# Patient Record
Sex: Male | Born: 1950 | Race: White | Hispanic: No | Marital: Married | Smoking: Former smoker
Health system: Southern US, Community
[De-identification: ages and names within clinical notes are randomized; demographics above are authoritative.]

## PROBLEM LIST (undated history)

## (undated) DIAGNOSIS — I739 Peripheral vascular disease, unspecified: Secondary | ICD-10-CM

## (undated) DIAGNOSIS — I639 Cerebral infarction, unspecified: Secondary | ICD-10-CM

## (undated) DIAGNOSIS — N209 Urinary calculus, unspecified: Secondary | ICD-10-CM

## (undated) DIAGNOSIS — N400 Enlarged prostate without lower urinary tract symptoms: Secondary | ICD-10-CM

## (undated) DIAGNOSIS — K219 Gastro-esophageal reflux disease without esophagitis: Secondary | ICD-10-CM

## (undated) DIAGNOSIS — N529 Male erectile dysfunction, unspecified: Secondary | ICD-10-CM

## (undated) DIAGNOSIS — N200 Calculus of kidney: Secondary | ICD-10-CM

## (undated) DIAGNOSIS — E271 Primary adrenocortical insufficiency: Secondary | ICD-10-CM

## (undated) DIAGNOSIS — I10 Essential (primary) hypertension: Secondary | ICD-10-CM

## (undated) DIAGNOSIS — E119 Type 2 diabetes mellitus without complications: Secondary | ICD-10-CM

## (undated) DIAGNOSIS — E7800 Pure hypercholesterolemia, unspecified: Secondary | ICD-10-CM

## (undated) DIAGNOSIS — R972 Elevated prostate specific antigen [PSA]: Secondary | ICD-10-CM

## (undated) DIAGNOSIS — I4891 Unspecified atrial fibrillation: Secondary | ICD-10-CM

## (undated) DIAGNOSIS — C4490 Unspecified malignant neoplasm of skin, unspecified: Secondary | ICD-10-CM

## (undated) DIAGNOSIS — Z87442 Personal history of urinary calculi: Secondary | ICD-10-CM

## (undated) HISTORY — DX: Unspecified malignant neoplasm of skin, unspecified: C44.90

## (undated) HISTORY — PX: PERCUTANEOUS STENT INTERVENTION: SHX6019

## (undated) HISTORY — DX: Elevated prostate specific antigen (PSA): R97.20

## (undated) HISTORY — DX: Urinary calculus, unspecified: N20.9

## (undated) HISTORY — DX: Cerebral infarction, unspecified: I63.9

## (undated) HISTORY — DX: Peripheral vascular disease, unspecified: I73.9

## (undated) HISTORY — DX: Type 2 diabetes mellitus without complications: E11.9

## (undated) HISTORY — DX: Benign prostatic hyperplasia without lower urinary tract symptoms: N40.0

## (undated) HISTORY — PX: SHOULDER SURGERY: SHX246

## (undated) HISTORY — DX: Pure hypercholesterolemia, unspecified: E78.00

## (undated) HISTORY — DX: Male erectile dysfunction, unspecified: N52.9

## (undated) HISTORY — PX: HERNIA REPAIR: SHX51

## (undated) HISTORY — DX: Gastro-esophageal reflux disease without esophagitis: K21.9

## (undated) HISTORY — DX: Calculus of kidney: N20.0

---

## 2009-02-14 ENCOUNTER — Emergency Department (HOSPITAL_COMMUNITY): Admission: EM | Admit: 2009-02-14 | Discharge: 2009-02-14 | Payer: Self-pay | Admitting: Emergency Medicine

## 2010-06-08 LAB — CBC
HCT: 45.7 % (ref 39.0–52.0)
MCHC: 33.4 g/dL (ref 30.0–36.0)
MCV: 92.4 fL (ref 78.0–100.0)
RBC: 4.95 MIL/uL (ref 4.22–5.81)
WBC: 9.6 10*3/uL (ref 4.0–10.5)

## 2010-06-08 LAB — URINALYSIS, ROUTINE W REFLEX MICROSCOPIC
Ketones, ur: 15 mg/dL — AB
Leukocytes, UA: NEGATIVE
Nitrite: POSITIVE — AB
Specific Gravity, Urine: >1.03 — ABNORMAL HIGH (ref 1.005–1.030)
Urobilinogen, UA: 0.2 mg/dL (ref 0.0–1.0)
pH: 5 (ref 5.0–8.0)

## 2010-06-08 LAB — URINE MICROSCOPIC-ADD ON

## 2010-06-08 LAB — COMPREHENSIVE METABOLIC PANEL
BUN: 12 mg/dL (ref 6–23)
CO2: 29 mEq/L (ref 19–32)
Calcium: 9.2 mg/dL (ref 8.4–10.5)
Creatinine, Ser: 0.84 mg/dL (ref 0.4–1.5)
GFR calc non Af Amer: >60 mL/min (ref >60)
Glucose, Bld: 229 mg/dL — ABNORMAL HIGH (ref 70–99)
Total Protein: 6.4 g/dL (ref 6.0–8.3)

## 2010-06-08 LAB — LIPASE, BLOOD: Lipase: 10 U/L — ABNORMAL LOW (ref 11–59)

## 2010-06-08 LAB — DIFFERENTIAL
Basophils Absolute: 0 10*3/uL (ref 0.0–0.1)
Eosinophils Relative: 1 % (ref 0–5)
Lymphocytes Relative: 18 % (ref 12–46)
Lymphs Abs: 1.7 10*3/uL (ref 0.7–4.0)
Monocytes Relative: 10 % (ref 3–12)
Neutrophils Relative %: 71 % (ref 43–77)

## 2010-06-08 LAB — URINE CULTURE: Culture: NO GROWTH

## 2016-05-05 HISTORY — PX: BACK SURGERY: SHX140

## 2016-10-10 ENCOUNTER — Encounter: Payer: Self-pay | Admitting: Internal Medicine

## 2016-11-30 ENCOUNTER — Ambulatory Visit: Payer: BLUE CROSS/BLUE SHIELD | Admitting: Nurse Practitioner

## 2017-01-12 ENCOUNTER — Ambulatory Visit: Payer: BLUE CROSS/BLUE SHIELD | Admitting: Nurse Practitioner

## 2017-03-17 ENCOUNTER — Encounter: Payer: Self-pay | Admitting: Nurse Practitioner

## 2017-03-17 ENCOUNTER — Ambulatory Visit (INDEPENDENT_AMBULATORY_CARE_PROVIDER_SITE_OTHER): Payer: Medicare Other | Admitting: Nurse Practitioner

## 2017-03-17 DIAGNOSIS — K219 Gastro-esophageal reflux disease without esophagitis: Secondary | ICD-10-CM | POA: Diagnosis not present

## 2017-03-17 DIAGNOSIS — R197 Diarrhea, unspecified: Secondary | ICD-10-CM | POA: Diagnosis not present

## 2017-03-17 DIAGNOSIS — K5900 Constipation, unspecified: Secondary | ICD-10-CM | POA: Diagnosis not present

## 2017-03-17 MED ORDER — LINACLOTIDE 72 MCG PO CAPS: 72.0000 ug | ORAL_CAPSULE | Freq: Every day | ORAL | 0 refills | Status: DC

## 2017-03-17 MED ORDER — PANTOPRAZOLE SODIUM 40 MG PO TBEC: 40.0000 mg | DELAYED_RELEASE_TABLET | Freq: Two times a day (BID) | ORAL | 3 refills | Status: DC

## 2017-03-17 NOTE — Assessment & Plan Note (Signed)
The patient has a long-standing history of chronic GERD.  He has been on Protonix daily.  He appears to have some worsening of his symptoms recently including nausea and vomiting.  He also has bitter taste in the back of his mouth.  He generally does not have any pain on exam today.  He has a remote history of peptic ulcer disease about 20 years ago.  No high risk factors for peptic ulcer disease as he does not take NSAIDs or aspirin powders because of his anticoagulation.  He theoretically could have H. pylori.  However, at this time he states his symptoms generally started when he started taking metformin.  I will increase his Protonix to twice today and see if this helps his symptoms improved.  Continue Zofran prescribed by primary care.  Return for follow-up in 2 months.  If he is still symptomatic we can consider a drug holiday versus repeat upper endoscopy.

## 2017-03-17 NOTE — Assessment & Plan Note (Signed)
Patient describes constipation where he will go 3-4 days without a bowel movement.  His first bowel movement is hard stools with significant straining.  While he does have diarrhea, it is typically following his constipation and passage of a hard stool.  Appears to be more constipation with overflow diarrhea.  I will start him on Linzess 72 mcg daily to see if this helps resolve his constipation and subsequent diarrhea.  Return for follow-up in 2 months.  If he is still having constipation we can discuss other options.  He is to call us with a progress report in 1-2 weeks and we can change agents or dosages at that time as well.

## 2017-03-17 NOTE — Patient Instructions (Signed)
1. Increase your Protonix to twice a day.  I sent in a refill so that she would not run out too soon. 2. I am starting you on Linzess 72 mcg.  Take this once a day on an empty stomach. 3. Call us in 1-2 weeks and let us know if it is helping your constipation and subsequent diarrhea after constipation. 4. Return for follow-up in 2 months. 5. Call us if you have any worsening symptoms before then. 6. Call us if you have any questions or concerns.

## 2017-03-17 NOTE — Assessment & Plan Note (Signed)
His diarrhea seems to be more constipation with overflow diarrhea.  We will treat his constipation as per above.  Return for follow-up in 2 months and see if he is still having diarrhea.  At that time we can consider requesting primary care to give a drug holiday of newly started metformin as his diarrhea could also be adverse effect related to his metformin.

## 2017-03-17 NOTE — Progress Notes (Signed)
Primary Care Physician:  Sherrilee Gilles, DO Primary Gastroenterologist:  Dr. Gala Romney  Chief Complaint  Patient presents with  . gastritis  . Nausea    w/ vomiting  . diarrhea/constipation    HPI:   Jack Mcbride is a 67 y.o. male who presents on referral from primary care for gastritis.  PCP notes included and reviewed from 09/26/2016.  At that time vomiting started the previous Wednesday and came without warning, unable to keep much down.  States he has been living on peanut butter crackers.  Noted weakness.  Other symptoms include nausea, anorexia.  Sudden onset.  They occur intermittently, moderate in severity per the patient, not exacerbated by any known factor.  Not relieved by any known factor.  At that time he was not complaining of diarrhea, constipation.  At that time he was using Pepto-Bismol for treatment.  Recommended labs, the results of which were not included with his records, as well as Zofran.  Referred to GI for further evaluation.  Today he states he's doing ok overall. Started having GERD symptoms including bitter regurgitation, nausea, vomiting. Symptoms occur about every other day. Denies hematemesis, hematochezia, melena. History of PUD treated by Dr. Scharlene Gloss several years ago (15-20 years ago). Symptoms started after starting Metformin. Denies fever, chills. Notes subjective unintentional weight loss of about 40 lbs in 2-3 years, although he admits he's been trying eat better from DM. Last colonoscopy 2014 by Dr. West Carbo and was normal per the patient and recommended repeat in 5 years. Also started having diarrhea after Metformin. Also with constipation for 3-4 days and will have a hard stool with straining and followed by diarrhea. Denies chest pain, dyspnea, dizziness, lightheadedness, syncope, near syncope. Denies any other upper or lower GI symptoms.  Denies NSAIDs and ASA powders.  Past Medical History:  Diagnosis Date  . Benign prostate hyperplasia   . Diabetes  ([place])   . Elevated PSA   . GERD (gastroesophageal reflux disease)   . Hypercholesterolemia   . Impotence of organic origin   . Kidney stone   . Peripheral artery disease ([place])    s/p stenting  . Skin cancer   . Stroke [facility])    2013 and 2017  . Urolithiasis     Past Surgical History:  Procedure Laterality Date  . BACK SURGERY  05/2016  . HERNIA REPAIR    . PERCUTANEOUS STENT INTERVENTION Left    Left leg for PAD  . SHOULDER SURGERY Right     Current Outpatient Medications  Medication Sig Dispense Refill  . aspirin EC 325 MG tablet Take by mouth daily.    Marland Kitchen atorvastatin (LIPITOR) 80 MG tablet Take by mouth daily.    . clopidogrel (PLAVIX) 75 MG tablet Take by mouth daily.    . finasteride (PROSCAR) 5 MG tablet Take 5 mg by mouth at bedtime.  3  . LEVEMIR FLEXTOUCH 100 UNIT/ML Pen 26 Units at bedtime.  1  . lisinopril (PRINIVIL,ZESTRIL) 10 MG tablet Take 5 mg by mouth 3 (three) times daily.  3  . metFORMIN (GLUCOPHAGE) 1000 MG tablet Take 1,000 mg by mouth 2 (two) times daily with a meal.  2  . nateglinide (STARLIX) 120 MG tablet Take 1 tablet by mouth 3 (three) times daily.    . niacin 100 MG tablet Take 100 mg by mouth daily.  1  . pantoprazole (PROTONIX) 40 MG tablet Take 1 tablet (40 mg total) by mouth 2 (two) times daily before a meal. 60  tablet 3  . tadalafil (CIALIS) 20 MG tablet 3 times/day as needed-between meals & bedtime.    . tamsulosin (FLOMAX) 0.4 MG CAPS capsule Take 0.4 mg by mouth at bedtime.  3  . traZODone (DESYREL) 50 MG tablet TAKE 1 TABLET BY MOUTH EVERYDAY AT BEDTIME  1  . vitamin B-12 (CYANOCOBALAMIN) 1000 MCG tablet Take by mouth daily.    Marland Kitchen linaclotide (LINZESS) 72 MCG capsule Take 1 capsule (72 mcg total) by mouth daily before breakfast. 16 capsule 0   No current facility-administered medications for this visit.     Allergies as of 03/17/2017 - Review Complete 03/17/2017  Allergen Reaction Noted  . Codeine Itching 09/05/2011    Family History    Problem Relation Age of Onset  . Colon cancer Neg Hx   . Gastric cancer Neg Hx   . Esophageal cancer Neg Hx     Social History   Socioeconomic History  . Marital status: Married    Spouse name: Not on file  . Number of children: Not on file  . Years of education: Not on file  . Highest education level: Not on file  Social Needs  . Financial resource strain: Not on file  . Food insecurity - worry: Not on file  . Food insecurity - inability: Not on file  . Transportation needs - medical: Not on file  . Transportation needs - non-medical: Not on file  Occupational History  . Not on file  Tobacco Use  . Smoking status: Current Every Day Smoker    Packs/day: 0.75    Years: 48.00    Pack years: 36.00    Types: Cigarettes    Start date: 03/07/1969  . Smokeless tobacco: Never Used  Substance and Sexual Activity  . Alcohol use: No    Frequency: Never  . Drug use: No  . Sexual activity: Not on file  Other Topics Concern  . Not on file  Social History Narrative  . Not on file    Review of Systems: General: Negative for anorexia, weight loss, fever, chills, fatigue, weakness. Eyes: Negative for vision changes.  ENT: Negative for hoarseness, difficulty swallowing , nasal congestion. CV: Negative for chest pain, angina, palpitations, dyspnea on exertion, peripheral edema.  Respiratory: Negative for dyspnea at rest, dyspnea on exertion, cough, sputum, wheezing.  GI: See history of present illness. GU:  Negative for dysuria, hematuria, urinary incontinence, urinary frequency, nocturnal urination.  MS: Negative for joint pain, low back pain.  Derm: Negative for rash or itching.  Neuro: Negative for weakness, abnormal sensation, seizure, frequent headaches, memory loss, confusion.  Psych: Negative for anxiety, depression, suicidal ideation, hallucinations.  Endo: Negative for unusual weight change.  Heme: Negative for bruising or bleeding. Allergy: Negative for rash or  hives.    Physical Exam: BP (!) 157/79   Pulse (!) 105   Temp 97.7 F (36.5 C) (Oral)   Ht 5\' 9"  (1.753 m)   Wt 170 lb 6.4 oz (77.3 kg)   BMI 25.16 kg/m  General:   Alert and oriented. Pleasant and cooperative. Well-nourished and well-developed.  Head:  Normocephalic and atraumatic. Eyes:  Without icterus, sclera clear and conjunctiva pink.  Ears:  Normal auditory acuity. Mouth:  No deformity or lesions, oral mucosa pink.  Throat/Neck:  Supple, without mass or thyromegaly. Cardiovascular:  S1, S2 present without murmurs appreciated. Normal pulses noted. Extremities without clubbing or edema. Respiratory:  Clear to auscultation bilaterally. No wheezes, rales, or rhonchi. No distress.  Gastrointestinal:  +  BS, soft, non-tender and non-distended. No HSM noted. No guarding or rebound. No masses appreciated.  Rectal:  Deferred  Musculoskalatal:  Symmetrical without gross deformities. Normal posture. Skin:  Intact without significant lesions or rashes. Neurologic:  Alert and oriented x4;  grossly normal neurologically. Psych:  Alert and cooperative. Normal mood and affect. Heme/Lymph/Immune: No significant cervical adenopathy. No excessive bruising noted.    03/17/2017 12:16 PM   Disclaimer: This note was dictated with voice recognition software. Similar sounding words can inadvertently be transcribed and may not be corrected upon review.

## 2017-03-20 NOTE — Progress Notes (Signed)
CC'D TO PCP °

## 2017-04-19 ENCOUNTER — Telehealth: Payer: Self-pay | Admitting: Internal Medicine

## 2017-04-19 NOTE — Telephone Encounter (Signed)
Pt's wife called to say that the [place] isn't working for him and was there something else he could take. He uses [facility] on [address] in [place]. [phone]

## 2017-04-20 NOTE — Telephone Encounter (Signed)
Linzess 72 mcg is causing diarrhea without stomach pain. Pt has to stay in the house the whole day. Pt has had diarrhea for two weeks since he has been taking Linzess 12mcg. Pt is taking one capsule 30 mins before breakfast.

## 2017-04-21 NOTE — Telephone Encounter (Signed)
Stop Linzess. Let's try Amitiza 8 mcg bid ON A FULL STOMACH. He can come to the office and pick up samples for 1-2 weeks; request progress report in 1-2 weeks.

## 2017-04-24 ENCOUNTER — Other Ambulatory Visit: Payer: Self-pay

## 2017-04-24 NOTE — Telephone Encounter (Signed)
Lmom, waiting on a return call.  

## 2017-04-24 NOTE — Telephone Encounter (Signed)
Spoke with pt. Pt didn't want to pick samples up because he doesn't live in [place]. RX phoned into pharmacy [facility] [address]

## 2017-05-08 ENCOUNTER — Telehealth: Payer: Self-pay | Admitting: Internal Medicine

## 2017-05-08 DIAGNOSIS — K5900 Constipation, unspecified: Secondary | ICD-10-CM

## 2017-05-08 NOTE — Telephone Encounter (Signed)
PLEASE CALL PATIENT ABOUT HIS MEDICATION, HE HAS SOME QUESTIONS, WHAT HE TRIED IS NOT WORKING [phone]

## 2017-05-08 NOTE — Telephone Encounter (Signed)
Pt was asked to d/c Linzess on 2/13 due to diarrhea and start Amitiza 24mcg. Pt feels he didn't have regular bowel movements on Amitiza 48mcg. No stomach pain or diarrhea to report. Pt is aware that EG is out of the office and is ok with waiting until he returns.

## 2017-05-10 NOTE — Telephone Encounter (Signed)
Lets try Amitiza 24 mcg bid (also on a full stomach). We can provide samples up to 1-2 weeks.  Call with progress report (as he did with the 8 mcg dose).

## 2017-05-10 NOTE — Telephone Encounter (Signed)
Lmom, waiting on a return call.  

## 2017-05-11 NOTE — Telephone Encounter (Signed)
Tried calling pt. Pt is going to call back.

## 2017-05-12 ENCOUNTER — Ambulatory Visit: Payer: Medicare Other | Admitting: Nurse Practitioner

## 2017-05-15 NOTE — Telephone Encounter (Signed)
Pts spouse called back, pt doesn't want to drive in the office for samples. He would like an RX of Amitiza 70mcg bid called into his pharmacy.

## 2017-05-15 NOTE — Telephone Encounter (Signed)
For EG 

## 2017-05-16 MED ORDER — LUBIPROSTONE 24 MCG PO CAPS: 24.0000 ug | ORAL_CAPSULE | Freq: Two times a day (BID) | ORAL | 3 refills | Status: DC

## 2017-05-16 NOTE — Telephone Encounter (Signed)
noted 

## 2017-05-16 NOTE — Addendum Note (Signed)
Addended by: Gordy Levan, ERIC A on: 05/16/2017 01:02 PM   Modules accepted: Orders

## 2017-05-16 NOTE — Telephone Encounter (Signed)
Rx to pharmacy per patient request. 

## 2017-06-27 HISTORY — PX: LOOP RECORDER INSERTION: SHX6722

## 2017-07-06 ENCOUNTER — Ambulatory Visit: Payer: Medicare Other | Admitting: Nurse Practitioner

## 2017-09-16 ENCOUNTER — Other Ambulatory Visit: Payer: Self-pay | Admitting: Nurse Practitioner

## 2017-09-16 DIAGNOSIS — K5900 Constipation, unspecified: Secondary | ICD-10-CM

## 2017-09-16 DIAGNOSIS — R197 Diarrhea, unspecified: Secondary | ICD-10-CM

## 2017-09-16 DIAGNOSIS — K219 Gastro-esophageal reflux disease without esophagitis: Secondary | ICD-10-CM

## 2017-09-28 ENCOUNTER — Ambulatory Visit (INDEPENDENT_AMBULATORY_CARE_PROVIDER_SITE_OTHER): Payer: Medicare Other | Admitting: Nurse Practitioner

## 2017-09-28 ENCOUNTER — Telehealth: Payer: Self-pay

## 2017-09-28 ENCOUNTER — Other Ambulatory Visit: Payer: Self-pay

## 2017-09-28 ENCOUNTER — Encounter: Payer: Self-pay | Admitting: Nurse Practitioner

## 2017-09-28 VITALS — BP 124/68 | HR 94 | Temp 98.2°F | Ht 69.5 in | Wt 162.8 lb

## 2017-09-28 DIAGNOSIS — R970 Elevated carcinoembryonic antigen [CEA]: Secondary | ICD-10-CM

## 2017-09-28 DIAGNOSIS — R197 Diarrhea, unspecified: Secondary | ICD-10-CM | POA: Diagnosis not present

## 2017-09-28 DIAGNOSIS — K219 Gastro-esophageal reflux disease without esophagitis: Secondary | ICD-10-CM

## 2017-09-28 DIAGNOSIS — K5900 Constipation, unspecified: Secondary | ICD-10-CM | POA: Diagnosis not present

## 2017-09-28 MED ORDER — NA SULFATE-K SULFATE-MG SULF 17.5-3.13-1.6 GM/177ML PO SOLN: 1.0000 | ORAL | 0 refills | Status: DC

## 2017-09-28 NOTE — Assessment & Plan Note (Signed)
GERD doing well on PPI. Continue PPI and follow-up in 6 months.

## 2017-09-28 NOTE — Assessment & Plan Note (Signed)
After trial of multiple medications, constipation is significantly improved on Amitiza 24 mcg. Continue current medications and follow-up in 6 months.

## 2017-09-28 NOTE — Telephone Encounter (Signed)
Called and informed pt's wife of pre-op appt 11/13/17 at 11:00am. Letter mailed.

## 2017-09-28 NOTE — Progress Notes (Signed)
CC'D TO PCP °

## 2017-09-28 NOTE — Assessment & Plan Note (Signed)
Diarrhea likely overflow with constipation.Now that constipation is better managed, his diarrhea has resolved. Contiue Amitiza and follow-up in 6 months.

## 2017-09-28 NOTE — Progress Notes (Signed)
Referring Provider: Sherrilee Gilles, DO Primary Care Physician:  Sherrilee Gilles, DO Primary GI:  Dr. Gala Romney  Chief Complaint  Patient presents with  . Constipation    doing better with Amitiza; [facility] requests TCS; had stroke in April    HPI:   Jack Mcbride is a 67 y.o. male who presents for follow-up on GERD, constipation, diarrhea.  Patient was last seen in our office 03/17/2017 for the same.  He was initially sent to our office for acute episode of nausea and vomiting with difficulty keeping down food and fluids.  At his last visit he was doing okay overall, GERD symptoms about every other day.  No hematochezia or melena.  History of PUD treated by Dr. Guinevere Ferrari of several years ago (15 to 20 years ago).  His symptoms started after taking metformin including subjective unintentional weight loss of 40 pounds in 2 to 3 years although he admits even trying to eat better due to diabetes.  Last colonoscopy 2014 and normal per the patient and recommended 5-year repeat.  Started having diarrhea after metformin.  Also with constipation for 3 to 4 days will have a hard stool with straining followed by diarrhea.  Is felt he likely had constipation with overflow diarrhea.  Commended increase Protonix to twice a day, start Linzess 72 mcg daily, progress report 1 to 2 weeks, follow-up in 2 months.  They called in approximately a month later saying Linzess was not helping.  Recommended Amitiza 8 mcg twice daily on a full stomach.  A prescription was sent to his pharmacy.  Recommended 1 to 2-week progress report.  He called 05/08/2017 noting no further abdominal pain but not having regular bowel movements.  No further diarrhea.  Recommended increase Amitiza 24 mcg twice daily.  Requested progress report.  Rx sent to pharmacy.  No further progress reports are called.  Today he states he's doing well overall. Constipation better, no further diarrhea. GERD doing well, no breakthrough symptoms. He had a CVA in April of  this year, doing well, no significant residual effects. Has had some hematuria and is scheduled for a cystoscopy next week. Denies abdominal pain, hematochezia, melena, fever, chills, unintentional weight loss. Did [place] the beach but was self-limiting. [facility] told him his CEA was mildly elevated and recommended repeat colonoscopy. Denies chest pain, dyspnea, dizziness, lightheadedness, syncope, near syncope. Denies any other upper or lower GI symptoms.  Past Medical History:  Diagnosis Date  . Benign prostate hyperplasia   . Diabetes ([place])   . Elevated PSA   . GERD (gastroesophageal reflux disease)   . Hypercholesterolemia   . Impotence of organic origin   . Kidney stone   . Peripheral artery disease ([place])    s/p stenting  . Skin cancer   . Stroke [facility])    2013 and 2017; 2019  . Urolithiasis     Past Surgical History:  Procedure Laterality Date  . BACK SURGERY  05/2016  . HERNIA REPAIR    . PERCUTANEOUS STENT INTERVENTION Left    Left leg for PAD  . SHOULDER SURGERY Right     Current Outpatient Medications  Medication Sig Dispense Refill  . aspirin 81 MG chewable tablet Chew 81 mg by mouth daily.    Marland Kitchen atorvastatin (LIPITOR) 80 MG tablet Take by mouth. Taking 3 times/week    . clopidogrel (PLAVIX) 75 MG tablet Take by mouth daily.    . finasteride (PROSCAR) 5 MG tablet Take 5 mg by  mouth at bedtime.  3  . LEVEMIR FLEXTOUCH 100 UNIT/ML Pen 26 Units at bedtime.  1  . lisinopril (PRINIVIL,ZESTRIL) 10 MG tablet Take 5 mg by mouth 3 (three) times daily.  3  . lubiprostone (AMITIZA) 24 MCG capsule Take 1 capsule (24 mcg total) by mouth 2 (two) times daily with a meal. 60 capsule 3  . metFORMIN (GLUCOPHAGE) 1000 MG tablet Take 1,000 mg by mouth 2 (two) times daily with a meal.  2  . niacin 100 MG tablet Take 500 mg by mouth daily.   1  . pantoprazole (PROTONIX) 40 MG tablet TAKE 1 TABLET BY MOUTH 2 TIMES DAILY BEFORE A MEAL. 180 tablet 1  . tadalafil (CIALIS) 20 MG tablet 3  times/day as needed-between meals & bedtime.    . tamsulosin (FLOMAX) 0.4 MG CAPS capsule Take 0.4 mg by mouth at bedtime.  3  . traZODone (DESYREL) 50 MG tablet TAKE 1 TABLET BY MOUTH EVERYDAY AT BEDTIME  1   No current facility-administered medications for this visit.     Allergies as of 09/28/2017 - Review Complete 09/28/2017  Allergen Reaction Noted  . Codeine Itching 09/05/2011    Family History  Problem Relation Age of Onset  . Colon cancer Neg Hx   . Gastric cancer Neg Hx   . Esophageal cancer Neg Hx     Social History   Socioeconomic History  . Marital status: Married    Spouse name: Not on file  . Number of children: Not on file  . Years of education: Not on file  . Highest education level: Not on file  Occupational History  . Not on file  Social Needs  . Financial resource strain: Not on file  . Food insecurity:    Worry: Not on file    Inability: Not on file  . Transportation needs:    Medical: Not on file    Non-medical: Not on file  Tobacco Use  . Smoking status: Current Every Day Smoker    Packs/day: 0.25    Years: 48.00    Pack years: 12.00    Types: Cigarettes    Start date: 03/07/1969  . Smokeless tobacco: Never Used  Substance and Sexual Activity  . Alcohol use: No    Frequency: Never  . Drug use: No  . Sexual activity: Not on file  Lifestyle  . Physical activity:    Days per week: Not on file    Minutes per session: Not on file  . Stress: Not on file  Relationships  . Social connections:    Talks on phone: Not on file    Gets together: Not on file    Attends religious service: Not on file    Active member of club or organization: Not on file    Attends meetings of clubs or organizations: Not on file    Relationship status: Not on file  Other Topics Concern  . Not on file  Social History Narrative  . Not on file    Review of Systems: General: Negative for anorexia, weight loss, fever, chills, fatigue, weakness. ENT: Negative for  hoarseness, nasal congestion. CV: Negative for chest pain, angina, palpitations, peripheral edema.  Respiratory: Negative for dyspnea at rest, cough, sputum, wheezing.  GI: See history of present illness.  Endo: Negative for unusual weight change.  Heme: Negative for bruising or bleeding.   Physical Exam: BP 124/68   Pulse 94   Temp 98.2 F (36.8 C) (Oral)   Ht 5'  9.5" (1.765 m)   Wt 162 lb 12.8 oz (73.8 kg)   BMI 23.70 kg/m  General:   Alert and oriented. Pleasant and cooperative. Well-nourished and well-developed.  Eyes:  Without icterus, sclera clear and conjunctiva pink.  Ears:  Normal auditory acuity. Cardiovascular:  S1, S2 present without murmurs appreciated. Normal pulses noted. Extremities without clubbing or edema. Respiratory:  Clear to auscultation bilaterally. No wheezes, rales, or rhonchi. No distress.  Gastrointestinal:  +BS, soft, non-tender and non-distended. No HSM noted. No guarding or rebound. No masses appreciated.  Rectal:  Deferred  Musculoskalatal:  Symmetrical without gross deformities. Neurologic:  Alert and oriented x4;  grossly normal neurologically. Psych:  Alert and cooperative. Normal mood and affect. Heme/Lymph/Immune: No excessive bruising noted.    09/28/2017 11:36 AM   Disclaimer: This note was dictated with voice recognition software. Similar sounding words can inadvertently be transcribed and may not be corrected upon review.

## 2017-09-28 NOTE — Assessment & Plan Note (Signed)
[facility] noted elevated CEA during CVA workup and recommended repeat colonoscopy. His last TCS was 2014 which the patient ststaes was normal and they recommended a 5 year repeat (unknown reason for early interval) and is currnetly due. We will proceed with colonoscopy at this time.  Proceed with TCS on propofol/MAC with Dr. Gala Romney in near future: the risks, benefits, and alternatives have been discussed with the patient in detail. The patient states understanding and desires to proceed.  Currently on Plavix which is generally not needed to be held.  He is currently on Plavix and Trazodone. No other anticoagulants, chronic pain medications, anxiolytics, or antidepressants. We will plan for the procedure on propofol/MAC to promote adequate sedation.

## 2017-09-28 NOTE — Patient Instructions (Signed)
1. Continue taking your current medications. 2. We will schedule your colonoscopy for you 3. Further recommendations will be made after your colonoscopy. 4. Return for follow-up in 6 months. 5. Call us if ou have any questions or concerns.  At [facility] we value your feedback. You may receive a survey about your visit today. Please share your experience as we strive to create trusting relationships with our patients to provide genuine, compassionate, quality care.  It was great to see you both today!  I hope you have a great summer!!

## 2017-11-08 NOTE — Patient Instructions (Addendum)
Your procedure is scheduled on: 11/20/2017  Report to [facility] at    12:00 pm.  Call this number if you have problems the morning of surgery: [phone]   Remember:              Follow Directions on the letter you received from Your Physician's office regarding the Bowel Prep  :  Take these medicines the morning of surgery with A SIP OF WATER: Amlodipine, Atenolol and Protonix        Take only 1/2 dose of levemir 13 units night before surgery               Do not wear jewelry, make-up or nail polish.    Do not bring valuables to the hospital.  Contacts, dentures or bridgework may not be worn into surgery.  .   Patients discharged the day of surgery will not be allowed to drive home.     Colonoscopy, Adult, Care After This sheet gives you information about how to care for yourself after your procedure. Your health care provider may also give you more specific instructions. If you have problems or questions, contact your health care provider. What can I expect after the procedure? After the procedure, it is common to have:  A small amount of blood in your stool for 24 hours after the procedure.  Some gas.  Mild abdominal cramping or bloating.  Follow these instructions at home: General instructions   For the first 24 hours after the procedure: ? Do not drive or use machinery. ? Do not sign important documents. ? Do not drink alcohol. ? Do your regular daily activities at a slower pace than normal. ? Eat soft, easy-to-digest foods. ? Rest often.  Take over-the-counter or prescription medicines only as told by your health care provider.  It is up to you to get the results of your procedure. Ask your health care provider, or the department performing the procedure, when your results will be ready. Relieving cramping and bloating  Try walking around when you have cramps or feel bloated.  Apply heat to your abdomen as told by your health care provider. Use a heat  source that your health care provider recommends, such as a moist heat pack or a heating pad. ? Place a towel between your skin and the heat source. ? Leave the heat on for 20-30 minutes. ? Remove the heat if your skin turns bright red. This is especially important if you are unable to feel pain, heat, or cold. You may have a greater risk of getting burned. Eating and drinking  Drink enough fluid to keep your urine clear or pale yellow.  Resume your normal diet as instructed by your health care provider. Avoid heavy or fried foods that are hard to digest.  Avoid drinking alcohol for as long as instructed by your health care provider. Contact a health care provider if:  You have blood in your stool 2-3 days after the procedure. Get help right away if:  You have more than a small spotting of blood in your stool.  You pass large blood clots in your stool.  Your abdomen is swollen.  You have nausea or vomiting.  You have a fever.  You have increasing abdominal pain that is not relieved with medicine. This information is not intended to replace advice given to you by your health care provider. Make sure you discuss any questions you have with your health care provider. Document Released: 10/06/2003 Document  Revised: 11/16/2015 Document Reviewed: 05/05/2015 Elsevier Interactive Patient Education  [employer].

## 2017-11-13 ENCOUNTER — Other Ambulatory Visit: Payer: Self-pay

## 2017-11-13 ENCOUNTER — Encounter (HOSPITAL_COMMUNITY): Payer: Self-pay

## 2017-11-13 ENCOUNTER — Encounter (HOSPITAL_COMMUNITY)
Admission: RE | Admit: 2017-11-13 | Discharge: 2017-11-13 | Disposition: A | Discharge status: Home / Self Care | Payer: Medicare Other | Source: Ambulatory Visit | Attending: Internal Medicine | Admitting: Internal Medicine

## 2017-11-13 DIAGNOSIS — Z01818 Encounter for other preprocedural examination: Secondary | ICD-10-CM | POA: Insufficient documentation

## 2017-11-13 LAB — BASIC METABOLIC PANEL
ANION GAP: 6 (ref 5–15)
BUN: 23 mg/dL (ref 8–23)
CHLORIDE: 108 mmol/L (ref 98–111)
CO2: 29 mmol/L (ref 22–32)
Calcium: 8.8 mg/dL — ABNORMAL LOW (ref 8.9–10.3)
Creatinine, Ser: 1.2 mg/dL (ref 0.61–1.24)
GFR calc Af Amer: >60 mL/min (ref >60)
GLUCOSE: 178 mg/dL — AB (ref 70–99)
Potassium: 4.2 mmol/L (ref 3.5–5.1)
SODIUM: 143 mmol/L (ref 135–145)

## 2017-11-13 LAB — CBC
HCT: 40 % (ref 39.0–52.0)
Hemoglobin: 13.3 g/dL (ref 13.0–17.0)
MCH: 30.8 pg (ref 26.0–34.0)
MCHC: 33.3 g/dL (ref 30.0–36.0)
MCV: 92.6 fL (ref 78.0–100.0)
PLATELETS: 124 10*3/uL — AB (ref 150–400)
RBC: 4.32 MIL/uL (ref 4.22–5.81)
RDW: 13.1 % (ref 11.5–15.5)
WBC: 11 10*3/uL — ABNORMAL HIGH (ref 4.0–10.5)

## 2017-11-20 ENCOUNTER — Ambulatory Visit (HOSPITAL_COMMUNITY)
Admission: RE | Admit: 2017-11-20 | Discharge: 2017-11-20 | Disposition: A | Discharge status: Home / Self Care | Payer: Medicare Other | Source: Ambulatory Visit | Attending: Internal Medicine | Admitting: Internal Medicine

## 2017-11-20 ENCOUNTER — Ambulatory Visit (HOSPITAL_COMMUNITY): Payer: Medicare Other | Admitting: Anesthesiology

## 2017-11-20 ENCOUNTER — Encounter (HOSPITAL_COMMUNITY)
Admission: RE | Disposition: A | Discharge status: Home / Self Care | Payer: Self-pay | Source: Ambulatory Visit | Attending: Internal Medicine

## 2017-11-20 ENCOUNTER — Telehealth: Payer: Self-pay

## 2017-11-20 ENCOUNTER — Encounter (HOSPITAL_COMMUNITY): Payer: Self-pay | Admitting: *Deleted

## 2017-11-20 DIAGNOSIS — Z7982 Long term (current) use of aspirin: Secondary | ICD-10-CM | POA: Insufficient documentation

## 2017-11-20 DIAGNOSIS — R970 Elevated carcinoembryonic antigen [CEA]: Secondary | ICD-10-CM | POA: Insufficient documentation

## 2017-11-20 DIAGNOSIS — Z85828 Personal history of other malignant neoplasm of skin: Secondary | ICD-10-CM | POA: Diagnosis not present

## 2017-11-20 DIAGNOSIS — E7800 Pure hypercholesterolemia, unspecified: Secondary | ICD-10-CM | POA: Insufficient documentation

## 2017-11-20 DIAGNOSIS — Z885 Allergy status to narcotic agent status: Secondary | ICD-10-CM | POA: Insufficient documentation

## 2017-11-20 DIAGNOSIS — E119 Type 2 diabetes mellitus without complications: Secondary | ICD-10-CM | POA: Diagnosis not present

## 2017-11-20 DIAGNOSIS — D128 Benign neoplasm of rectum: Secondary | ICD-10-CM | POA: Diagnosis not present

## 2017-11-20 DIAGNOSIS — Z8673 Personal history of transient ischemic attack (TIA), and cerebral infarction without residual deficits: Secondary | ICD-10-CM | POA: Insufficient documentation

## 2017-11-20 DIAGNOSIS — F17210 Nicotine dependence, cigarettes, uncomplicated: Secondary | ICD-10-CM | POA: Diagnosis not present

## 2017-11-20 DIAGNOSIS — D120 Benign neoplasm of cecum: Secondary | ICD-10-CM | POA: Diagnosis not present

## 2017-11-20 DIAGNOSIS — T184XXA Foreign body in colon, initial encounter: Secondary | ICD-10-CM

## 2017-11-20 DIAGNOSIS — Z79899 Other long term (current) drug therapy: Secondary | ICD-10-CM | POA: Insufficient documentation

## 2017-11-20 DIAGNOSIS — Z794 Long term (current) use of insulin: Secondary | ICD-10-CM | POA: Diagnosis not present

## 2017-11-20 DIAGNOSIS — K5730 Diverticulosis of large intestine without perforation or abscess without bleeding: Secondary | ICD-10-CM | POA: Diagnosis not present

## 2017-11-20 DIAGNOSIS — K219 Gastro-esophageal reflux disease without esophagitis: Secondary | ICD-10-CM | POA: Insufficient documentation

## 2017-11-20 DIAGNOSIS — N529 Male erectile dysfunction, unspecified: Secondary | ICD-10-CM | POA: Diagnosis not present

## 2017-11-20 HISTORY — PX: POLYPECTOMY: SHX5525

## 2017-11-20 HISTORY — PX: COLONOSCOPY WITH PROPOFOL: SHX5780

## 2017-11-20 LAB — GLUCOSE, CAPILLARY
GLUCOSE-CAPILLARY: 277 mg/dL — AB (ref 70–99)
Glucose-Capillary: 221 mg/dL — ABNORMAL HIGH (ref 70–99)

## 2017-11-20 SURGERY — COLONOSCOPY WITH PROPOFOL
Anesthesia: Monitor Anesthesia Care

## 2017-11-20 MED ORDER — CHLORHEXIDINE GLUCONATE CLOTH 2 % EX PADS: 6.0000 | MEDICATED_PAD | Freq: Once | CUTANEOUS | Status: DC

## 2017-11-20 MED ORDER — PROPOFOL 500 MG/50ML IV EMUL
INTRAVENOUS | Status: DC | PRN
  Administered 2017-11-20: 14:00:00 via INTRAVENOUS
  Administered 2017-11-20: 150 ug/kg/min via INTRAVENOUS

## 2017-11-20 MED ORDER — LACTATED RINGERS IV SOLN
INTRAVENOUS | Status: DC
  Administered 2017-11-20: 14:00:00 via INTRAVENOUS

## 2017-11-20 MED ORDER — PROPOFOL 10 MG/ML IV BOLUS
INTRAVENOUS | Status: DC | PRN
  Administered 2017-11-20: 20 mg via INTRAVENOUS
  Administered 2017-11-20: 30 mg via INTRAVENOUS

## 2017-11-20 MED ORDER — SPOT INK MARKER SYRINGE KIT
PACK | SUBMUCOSAL | Status: AC
  Filled 2017-11-20: qty 5

## 2017-11-20 MED ORDER — PROPOFOL 10 MG/ML IV BOLUS
INTRAVENOUS | Status: AC
  Filled 2017-11-20: qty 40

## 2017-11-20 MED ORDER — SPOT INK MARKER SYRINGE KIT
PACK | SUBMUCOSAL | Status: DC | PRN
  Administered 2017-11-20: 1 mL via SUBMUCOSAL

## 2017-11-20 NOTE — H&P (Signed)
@LOGO @   Primary Care Physician:  Sherrilee Gilles, DO Primary Gastroenterologist:  Dr. Gala Romney  Pre-Procedure History & Physical: HPI:  Jack Mcbride is a 67 y.o. male here a colonoscopy.  History of elevated CEA at [facility]. No bowel symptoms. Specifically, no bleeding no melena , no constipation or diarrhea.  . No hx of Colon cancer or advanced adenoma.  Past Medical History:  Diagnosis Date  . Benign prostate hyperplasia   . Diabetes ([place])   . Elevated PSA   . GERD (gastroesophageal reflux disease)   . Hypercholesterolemia   . Impotence of organic origin   . Kidney stone   . Peripheral artery disease ([place])    s/p stenting  . Skin cancer   . Stroke [facility])    2013 and 2017; 2019  . Urolithiasis     Past Surgical History:  Procedure Laterality Date  . BACK SURGERY  05/2016  . HERNIA REPAIR    . LOOP RECORDER INSERTION  06/27/2017  . PERCUTANEOUS STENT INTERVENTION Left    Left leg for PAD  . SHOULDER SURGERY Right     Prior to Admission medications   Medication Sig Start Date End Date Taking? Authorizing Provider  amLODipine-benazepril (LOTREL) 5-20 MG capsule Take 1 capsule by mouth daily. 05/31/17  Yes [provider]  aspirin 81 MG chewable tablet Chew 81 mg by mouth daily.   Yes [provider]  atenolol (TENORMIN) 25 MG tablet Take 25 mg by mouth daily.   Yes [provider]  atorvastatin (LIPITOR) 80 MG tablet Take 80 mg by mouth See admin instructions. Taking 3 times/week 10/13/15  Yes [provider]  finasteride (PROSCAR) 5 MG tablet Take 5 mg by mouth at bedtime. 01/11/17  Yes [provider]  furosemide (LASIX) 20 MG tablet Take 20 mg by mouth See admin instructions. Pt can take 1 tab for 3 days as needed for ankle swelling   Yes [provider]  glimepiride (AMARYL) 2 MG tablet Take 2 mg by mouth 2 (two) times daily.   Yes [provider]  LEVEMIR FLEXTOUCH 100 UNIT/ML Pen Inject 28 Units into the skin at  bedtime.  12/23/16  Yes [provider]  lubiprostone (AMITIZA) 24 MCG capsule Take 1 capsule (24 mcg total) by mouth 2 (two) times daily with a meal. 05/16/17  Yes Carlis Stable, NP  metFORMIN (GLUCOPHAGE) 1000 MG tablet Take 1,000 mg by mouth 2 (two) times daily with a meal. 02/23/17  Yes [provider]  niacin 500 MG tablet Take 500 mg by mouth daily.  02/02/17  Yes [provider]  pantoprazole (PROTONIX) 40 MG tablet TAKE 1 TABLET BY MOUTH 2 TIMES DAILY BEFORE A MEAL. Patient taking differently: Take 40 mg by mouth daily.  09/18/17  Yes Annitta Needs, NP  tamsulosin (FLOMAX) 0.4 MG CAPS capsule Take 0.4 mg by mouth at bedtime. 12/26/16  Yes [provider]  traZODone (DESYREL) 50 MG tablet Take 50 mg by mouth at bedtime.  02/05/17  Yes [provider]  tadalafil (CIALIS) 20 MG tablet 3 times/day as needed-between meals & bedtime. 10/03/15   [provider]    Allergies as of 09/28/2017 - Review Complete 09/28/2017  Allergen Reaction Noted  . Codeine Itching 09/05/2011    Family History  Problem Relation Age of Onset  . Colon cancer Neg Hx   . Gastric cancer Neg Hx   . Esophageal cancer Neg Hx     Social History  Socioeconomic History  . Marital status: Married    Spouse name: Not on file  . Number of children: Not on file  . Years of education: Not on file  . Highest education level: Not on file  Occupational History  . Not on file  Social Needs  . Financial resource strain: Not on file  . Food insecurity:    Worry: Not on file    Inability: Not on file  . Transportation needs:    Medical: Not on file    Non-medical: Not on file  Tobacco Use  . Smoking status: Current Every Day Smoker    Packs/day: 0.25    Years: 48.00    Pack years: 12.00    Types: Cigarettes    Start date: 03/07/1969  . Smokeless tobacco: Never Used  Substance and Sexual Activity  . Alcohol use: No    Frequency: Never  . Drug use: No  .  Sexual activity: Not on file  Lifestyle  . Physical activity:    Days per week: Not on file    Minutes per session: Not on file  . Stress: Not on file  Relationships  . Social connections:    Talks on phone: Not on file    Gets together: Not on file    Attends religious service: Not on file    Active member of club or organization: Not on file    Attends meetings of clubs or organizations: Not on file    Relationship status: Not on file  . Intimate partner violence:    Fear of current or ex partner: Not on file    Emotionally abused: Not on file    Physically abused: Not on file    Forced sexual activity: Not on file  Other Topics Concern  . Not on file  Social History Narrative  . Not on file    Review of Systems: See HPI, otherwise negative ROS  Physical Exam: BP 127/80   Pulse 81   Temp 98.6 F (37 C) (Oral)   Resp 16   SpO2 93%  General:   Alert,  Well-developed, well-nourished, pleasant and cooperative in NAD Neck:  Supple; no masses or thyromegaly. No significant cervical adenopathy. Lungs:  Clear throughout to auscultation.   No wheezes, crackles, or rhonchi. No acute distress. Heart:  Regular rate and rhythm; no murmurs, clicks, rubs,  or gallops. Abdomen: Non-distended, normal bowel sounds.  Soft and nontender without appreciable mass or hepatosplenomegaly.  Pulses:  Normal pulses noted. Extremities:  Without clubbing or edema.  Impression/Plan:  67 year old  Gentleman with a history of CEA at [facility]; TCS now being done.  The risks, benefits, limitations, alternatives and imponderables have been reviewed with the patient. Questions have been answered. All parties are agreeable.     Notice: This dictation was prepared with Dragon dictation along with smaller phrase technology. Any transcriptional errors that result from this process are unintentional and may not be corrected upon review.

## 2017-11-20 NOTE — Anesthesia Postprocedure Evaluation (Signed)
Anesthesia Post Note  Patient: Jack Mcbride  Procedure(s) Performed: COLONOSCOPY WITH PROPOFOL (N/A ) POLYPECTOMY  Patient location during evaluation: PACU Anesthesia Type: MAC Level of consciousness: awake, sedated and patient cooperative Pain management: pain level controlled Vital Signs Assessment: post-procedure vital signs reviewed and stable Respiratory status: spontaneous breathing Cardiovascular status: stable Postop Assessment: no apparent nausea or vomiting Anesthetic complications: no     Last Vitals:  Vitals:   11/20/17 1211  BP: 127/80  Pulse: 81  Resp: 16  Temp: 37 C  SpO2: 93%    Last Pain:  Vitals:   11/20/17 1211  TempSrc: Oral  PainSc: 0-No pain                 ADAMS, AMY A

## 2017-11-20 NOTE — Addendum Note (Signed)
Addended by: Hassan Rowan on: 11/20/2017 04:03 PM   Modules accepted: Orders

## 2017-11-20 NOTE — Anesthesia Preprocedure Evaluation (Signed)
Anesthesia Evaluation  Patient identified by MRN, date of birth, ID band Patient awake    Reviewed: Allergy & Precautions, H&P , NPO status , Patient's Chart, lab work & pertinent test results  Airway Mallampati: I  TM Distance: >3 FB Neck ROM: full    Dental no notable dental hx.    Pulmonary neg pulmonary ROS, Current Smoker,    Pulmonary exam normal breath sounds clear to auscultation       Cardiovascular Exercise Tolerance: Good + Peripheral Vascular Disease  negative cardio ROS   Rhythm:regular Rate:Normal     Neuro/Psych CVA negative neurological ROS  negative psych ROS   GI/Hepatic negative GI ROS, Neg liver ROS, GERD  ,  Endo/Other  negative endocrine ROSdiabetes  Renal/GU Renal diseasenegative Renal ROS  negative genitourinary   Musculoskeletal   Abdominal   Peds  Hematology negative hematology ROS (+)   Anesthesia Other Findings Elevated CEA  Reproductive/Obstetrics negative OB ROS                             Anesthesia Physical Anesthesia Plan  ASA: III  Anesthesia Plan: MAC   Post-op Pain Management:    Induction:   PONV Risk Score and Plan:   Airway Management Planned:   Additional Equipment:   Intra-op Plan:   Post-operative Plan:   Informed Consent: I have reviewed the patients History and Physical, chart, labs and discussed the procedure including the risks, benefits and alternatives for the proposed anesthesia with the patient or authorized representative who has indicated his/her understanding and acceptance.   Dental Advisory Given  Plan Discussed with: CRNA  Anesthesia Plan Comments:         Anesthesia Quick Evaluation

## 2017-11-20 NOTE — Anesthesia Procedure Notes (Signed)
Procedure Name: MAC Performed by: Kaylee Wombles A, CRNA Pre-anesthesia Checklist: Patient identified, Emergency Drugs available, Suction available, Patient being monitored and Timeout performed Oxygen Delivery Method: Simple face mask       

## 2017-11-20 NOTE — Telephone Encounter (Signed)
Per RMR:  Patient needs a KUB x-ray on September 25 (1 day before his scheduled back MRI) to check on the 5 clips placed in his colon today.

## 2017-11-20 NOTE — Discharge Instructions (Signed)
Colonoscopy Discharge Instructions  Read the instructions outlined below and refer to this sheet in the next few weeks. These discharge instructions provide you with general information on caring for yourself after you leave the hospital. Your doctor may also give you specific instructions. While your treatment has been planned according to the most current medical practices available, unavoidable complications occasionally occur. If you have any problems or questions after discharge, call Dr. Gala Romney at [phone]. ACTIVITY  You may resume your regular activity, but move at a slower pace for the next 24 hours.   Take frequent rest periods for the next 24 hours.   Walking will help get rid of the air and reduce the bloated feeling in your belly (abdomen).   No driving for 24 hours (because of the medicine (anesthesia) used during the test).    Do not sign any important legal documents or operate any machinery for 24 hours (because of the anesthesia used during the test).  NUTRITION  Drink plenty of fluids.   You may resume your normal diet as instructed by your doctor.   Begin with a light meal and progress to your normal diet. Heavy or fried foods are harder to digest and may make you feel sick to your stomach (nauseated).   Avoid alcoholic beverages for 24 hours or as instructed.  MEDICATIONS  You may resume your normal medications unless your doctor tells you otherwise.  WHAT YOU CAN EXPECT TODAY  Some feelings of bloating in the abdomen.   Passage of more gas than usual.   Spotting of blood in your stool or on the toilet paper.  IF YOU HAD POLYPS REMOVED DURING THE COLONOSCOPY:  No aspirin products for 7 days or as instructed.   No alcohol for 7 days or as instructed.   Eat a soft diet for the next 24 hours.  FINDING OUT THE RESULTS OF YOUR TEST Not all test results are available during your visit. If your test results are not back during the visit, make an appointment  with your caregiver to find out the results. Do not assume everything is normal if you have not heard from your caregiver or the medical facility. It is important for you to follow up on all of your test results.  SEEK IMMEDIATE MEDICAL ATTENTION IF:  You have more than a spotting of blood in your stool.   Your belly is swollen (abdominal distention).   You are nauseated or vomiting.   You have a temperature over 101.   You have abdominal pain or discomfort that is severe or gets worse throughout the day.   Diverticulosis Diverticulosis is a condition that develops when small pouches (diverticula) form in the wall of the large intestine (colon). The colon is where water is absorbed and stool is formed. The pouches form when the inside layer of the colon pushes through weak spots in the outer layers of the colon. You may have a few pouches or many of them. What are the causes? The cause of this condition is not known. What increases the risk? The following factors may make you more likely to develop this condition:  Being older than age 62. Your risk for this condition increases with age. Diverticulosis is rare among people younger than age 48. By age 46, many people have it.  Eating a low-fiber diet.  Having frequent constipation.  Being overweight.  Not getting enough exercise.  Smoking.  Taking over-the-counter pain medicines, like aspirin and ibuprofen.  Having a  family history of diverticulosis.  What are the signs or symptoms? In most people, there are no symptoms of this condition. If you do have symptoms, they may include:  Bloating.  Cramps in the abdomen.  Constipation or diarrhea.  Pain in the lower left side of the abdomen.  How is this diagnosed? This condition is most often diagnosed during an exam for other colon problems. Because diverticulosis usually has no symptoms, it often cannot be diagnosed independently. This condition may be diagnosed  by:  Using a flexible scope to examine the colon (colonoscopy).  Taking an X-ray of the colon after dye has been put into the colon (barium enema).  Doing a CT scan.  How is this treated? You may not need treatment for this condition if you have never developed an infection related to diverticulosis. If you have had an infection before, treatment may include:  Eating a high-fiber diet. This may include eating more fruits, vegetables, and grains.  Taking a fiber supplement.  Taking a live bacteria supplement (probiotic).  Taking medicine to relax your colon.  Taking antibiotic medicines.  Follow these instructions at home:  Drink 6-8 glasses of water or more each day to prevent constipation.  Try not to strain when you have a bowel movement.  If you have had an infection before: ? Eat more fiber as directed by your health care provider or your diet and nutrition specialist (dietitian). ? Take a fiber supplement or probiotic, if your health care provider approves.  Take over-the-counter and prescription medicines only as told by your health care provider.  If you were prescribed an antibiotic, take it as told by your health care provider. Do not stop taking the antibiotic even if you start to feel better.  Keep all follow-up visits as told by your health care provider. This is important. Contact a health care provider if:  You have pain in your abdomen.  You have bloating.  You have cramps.  You have not had a bowel movement in 3 days. Get help right away if:  Your pain gets worse.  Your bloating becomes very bad.  You have a fever or chills, and your symptoms suddenly get worse.  You vomit.  You have bowel movements that are bloody or black.  You have bleeding from your rectum. Summary  Diverticulosis is a condition that develops when small pouches (diverticula) form in the wall of the large intestine (colon).  You may have a few pouches or many of  them.  This condition is most often diagnosed during an exam for other colon problems.  If you have had an infection related to diverticulosis, treatment may include increasing the fiber in your diet, taking supplements, or taking medicines. This information is not intended to replace advice given to you by your health care provider. Make sure you discuss any questions you have with your health care provider. Document Released: 11/19/2003 Document Revised: 01/11/2016 Document Reviewed: 01/11/2016 Elsevier Interactive Patient Education  2017 [employer].  Colon Polyps Polyps are tissue growths inside the body. Polyps can grow in many places, including the large intestine (colon). A polyp may be a round bump or a mushroom-shaped growth. You could have one polyp or several. Most colon polyps are noncancerous (benign). However, some colon polyps can become cancerous over time. What are the causes? The exact cause of colon polyps is not known. What increases the risk? This condition is more likely to develop in people who:  Have a family history  of colon cancer or colon polyps.  Are older than 106 or older than 45 if they are African American.  Have inflammatory bowel disease, such as ulcerative colitis or Crohn disease.  Are overweight.  Smoke cigarettes.  Do not get enough exercise.  Drink too much alcohol.  Eat a diet that is: ? High in fat and red meat. ? Low in fiber.  Had childhood cancer that was treated with abdominal radiation.  What are the signs or symptoms? Most polyps do not cause symptoms. If you have symptoms, they may include:  Blood coming from your rectum when having a bowel movement.  Blood in your stool.The stool may look dark red or black.  A change in bowel habits, such as constipation or diarrhea.  How is this diagnosed? This condition is diagnosed with a colonoscopy. This is a procedure that uses a lighted, flexible scope to look at the inside of  your colon. How is this treated? Treatment for this condition involves removing any polyps that are found. Those polyps will then be tested for cancer. If cancer is found, your health care provider will talk to you about options for colon cancer treatment. Follow these instructions at home: Diet  Eat plenty of fiber, such as fruits, vegetables, and whole grains.  Eat foods that are high in calcium and vitamin D, such as milk, cheese, yogurt, eggs, liver, fish, and broccoli.  Limit foods high in fat, red meats, and processed meats, such as hot dogs, sausage, bacon, and lunch meats.  Maintain a healthy weight, or lose weight if recommended by your health care provider. General instructions  Do not smoke cigarettes.  Do not drink alcohol excessively.  Keep all follow-up visits as told by your health care provider. This is important. This includes keeping regularly scheduled colonoscopies. Talk to your health care provider about when you need a colonoscopy.  Exercise every day or as told by your health care provider. Contact a health care provider if:  You have new or worsening bleeding during a bowel movement.  You have new or increased blood in your stool.  You have a change in bowel habits.  You unexpectedly lose weight. This information is not intended to replace advice given to you by your health care provider. Make sure you discuss any questions you have with your health care provider. Document Released: 11/18/2003 Document Revised: 07/30/2015 Document Reviewed: 01/12/2015 Elsevier Interactive Patient Education  2018 [employer].     Diverticulosis and colon polyp information provided  Further recommendations to follow pending review of pathology report  No future MRI until clips gone

## 2017-11-20 NOTE — Telephone Encounter (Signed)
Called pt, he is scheduled for MRI at mobile unit in [facility] 11/30/17. Order entered for KUB. He is aware no appt needed for x-ray at [facility]. He will have x-ray 11/29/17.

## 2017-11-20 NOTE — Op Note (Signed)
[facility] Patient Name: Jack Mcbride Procedure Date: 11/20/2017 1:43 PM MRN: [id number] Date of Birth: 1950/05/04 Attending MD: Norvel Richards , MD CSN: [id number] Age: 67 Admit Type: Outpatient Procedure:                Colonoscopy Indications:              Elevated "CEA" Providers:                Norvel Richards, MD, Lurline Del, RN, Nelma Rothman, Technician Referring MD:             Sherrilee Gilles Medicines:                Propofol per Anesthesia Complications:            No immediate complications. Estimated Blood Loss:     Estimated blood loss: none. Procedure:                Pre-Anesthesia Assessment:                           - Prior to the procedure, a History and Physical                            was performed, and patient medications and                            allergies were reviewed. The patient's tolerance of                            previous anesthesia was also reviewed. The risks                            and benefits of the procedure and the sedation                            options and risks were discussed with the patient.                            All questions were answered, and informed consent                            was obtained. Prior Anticoagulants: The patient has                            taken no previous anticoagulant or antiplatelet                            agents. ASA Grade Assessment: II - A patient with                            mild systemic disease. After reviewing the risks  and benefits, the patient was deemed in                            satisfactory condition to undergo the procedure.                           After obtaining informed consent, the colonoscope                            was passed under direct vision. Throughout the                            procedure, the patient's blood pressure, pulse, and                            oxygen saturations were  monitored continuously. The                            CF-HQ190L ([id number]) scope was introduced through                            the and advanced to the the cecum, identified by                            appendiceal orifice and ileocecal valve. The                            colonoscopy was performed without difficulty. The                            patient tolerated the procedure well. The quality                            of the bowel preparation was adequate. Scope In: 1:53:28 PM Scope Out: 2:30:20 PM Scope Withdrawal Time: 0 hours 32 minutes 4 seconds  Total Procedure Duration: 0 hours 36 minutes 52 seconds  Findings:      The perianal and digital rectal examinations were normal.      A few small-mouthed diverticula were found in the sigmoid colon and       descending colon.      Two sessile polyps were found in the cecum and rectum. The polyps were 5       to 30 mm in size. Rectal polyp - 5 mm in dimensions. the polyp in the       cecum/opposite ileocecal valve. Was 2 x 3 cm. It was a "carpet polyp".       This polyp was removed totally with piecemeal hot snare polypectomy. 4       clips placed to ensure hemostasis.These polyps were removed with a hot       snare. A single hemostasis cliop placed on the rectal polypectomy site.       Resection and retrieval were complete. Estimated blood loss: none. the       cecal polypectomy site was also tattooed.      The exam was otherwise without abnormality on direct and retroflexion  views. Impression:               - Diverticulosis in the sigmoid colon and in the                            descending colon.                           - Two 5 to 30 mm polyps in the cecum and rectum,                            removed with a hot snare. Resected and retrieved.                            hemostasis clips applied. Cecal Polypectomy site                            tattooed.                           - The examination was otherwise  normal on direct                            and retroflexion views. Moderate Sedation:      Moderate (conscious) sedation was personally administered by an       anesthesia professional. The following parameters were monitored: oxygen       saturation, heart rate, blood pressure, respiratory rate, EKG, adequacy       of pulmonary ventilation, and response to care. Total physician       intraservice time was 40 minutes. Recommendation:           - Patient has a contact number available for                            emergencies. The signs and symptoms of potential                            delayed complications were discussed with the                            patient. Return to normal activities tomorrow.                            Written discharge instructions were provided to the                            patient.                           - Advance diet as tolerated.                           - Continue present medications.                           - Await pathology results.                           -  Repeat colonoscopy date to be determined after                            pending pathology results are reviewed for                            surveillance based on pathology results.                           - Return to GI office (date not yet determined). 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 All rights reserved. The codes documented in this  report are preliminary and upon coder review may  be revised to meet current compliance requirements. Cristopher Estimable. Malya Cirillo, MD Norvel Richards, MD 11/20/2017 2:45:58 PM This report has been signed electronically. Number of Addenda: 0

## 2017-11-20 NOTE — Transfer of Care (Signed)
Immediate Anesthesia Transfer of Care Note  Patient: ESLI JERNIGAN  Procedure(s) Performed: COLONOSCOPY WITH PROPOFOL (N/A ) POLYPECTOMY  Patient Location: PACU  Anesthesia Type:MAC  Level of Consciousness: awake, oriented and patient cooperative  Airway & Oxygen Therapy: Patient Spontanous Breathing  Post-op Assessment: Report given to RN and Post -op Vital signs reviewed and stable  Post vital signs: Reviewed and stable  Last Vitals:  Vitals Value Taken Time  BP    Temp    Pulse 68 11/20/2017  2:39 PM  Resp    SpO2 97 % 11/20/2017  2:39 PM  Vitals shown include unvalidated device data.  Last Pain:  Vitals:   11/20/17 1211  TempSrc: Oral  PainSc: 0-No pain         Complications: No apparent anesthesia complications

## 2017-11-23 ENCOUNTER — Encounter (HOSPITAL_COMMUNITY): Payer: Self-pay | Admitting: Internal Medicine

## 2017-11-23 ENCOUNTER — Encounter: Payer: Self-pay | Admitting: Internal Medicine

## 2017-11-25 ENCOUNTER — Other Ambulatory Visit: Payer: Self-pay | Admitting: Nurse Practitioner

## 2017-11-29 ENCOUNTER — Ambulatory Visit (HOSPITAL_COMMUNITY)
Admission: RE | Admit: 2017-11-29 | Discharge: 2017-11-29 | Disposition: A | Discharge status: Home / Self Care | Payer: Medicare Other | Source: Ambulatory Visit | Attending: Internal Medicine | Admitting: Internal Medicine

## 2017-11-29 DIAGNOSIS — X58XXXA Exposure to other specified factors, initial encounter: Secondary | ICD-10-CM | POA: Diagnosis not present

## 2017-11-29 DIAGNOSIS — T184XXA Foreign body in colon, initial encounter: Secondary | ICD-10-CM | POA: Diagnosis present

## 2017-12-20 ENCOUNTER — Other Ambulatory Visit: Payer: Self-pay

## 2017-12-20 ENCOUNTER — Encounter: Payer: Self-pay | Admitting: Gastroenterology

## 2017-12-20 ENCOUNTER — Ambulatory Visit (INDEPENDENT_AMBULATORY_CARE_PROVIDER_SITE_OTHER): Payer: Medicare Other | Admitting: Gastroenterology

## 2017-12-20 DIAGNOSIS — R6881 Early satiety: Secondary | ICD-10-CM

## 2017-12-20 DIAGNOSIS — K920 Hematemesis: Secondary | ICD-10-CM | POA: Diagnosis not present

## 2017-12-20 DIAGNOSIS — R634 Abnormal weight loss: Secondary | ICD-10-CM

## 2017-12-20 NOTE — Assessment & Plan Note (Signed)
67 year old gentleman with recent hematemesis, no melena with chronic picture of intermittent vomiting, 40 pound weight loss in the past 1 year, poor appetite, early satiety.  He gives history of remote peptic ulcer disease.  Denies NSAIDs.  He takes aspirin 81 mg daily.  Differential diagnosis includes peptic ulcer disease, underlying diabetic gastroparesis, complicated GERD, M-W tear, malignancy.  Recommend EGD for further evaluation.  I have discussed the risks, alternatives, benefits with regards to but not limited to the risk of reaction to medication, bleeding, infection, perforation and the patient is agreeable to proceed. Written consent to be obtained.  If patient has recurrent hematemesis, melena, progressive fatigue/weakness he should let someone know immediately or go to the nearest emergency department.  Regarding weight loss, he does have history of peripheral vascular disease requiring stents for the lower extremities.  He continues to smoke.  His symptoms are not classic of mesenteric ischemia but remains on the differential. We will need to consider pending EGD results.

## 2017-12-20 NOTE — Progress Notes (Signed)
Primary Care Physician:  Sherrilee Gilles, DO  Primary Gastroenterologist:  Garfield Cornea, MD   Chief Complaint  Patient presents with  . Emesis    more often since TCS 11/30/17. On wed/sat he was vomiting blood (bright red), 1 episode but was large amount. he went to [facility] in [place], [place] and was diagnosed with upp GI bleed. Was told he ? needed EGD    HPI:  Jack Mcbride is a 67 y.o. male here for further evaluation of hematemesis.  We saw him last back in September at time of colonoscopy to evaluate elevated CEA.  He was noted to have 2 polyps, measuring 5 to 30 mm, tubular adenoma.  One removed piecemeal from the cecum, site tattooed.  Plans for follow-up colonoscopy in 6 months.  Patient has approximately 30-month history of intermittent vomiting associated with nausea.  Occurs at least weekly.  Last Wednesday he vomited what he thought was blood but he was not sure.  Saturday he vomited a large amount of blood, fresh looking.  He went to the urgent care.  He was told his blood count was normal.  He was advised to follow-up with [place].  Last episode of vomiting last night with no blood present.  Does denies melena.  Denies frequent retching.  He notes that his appetite is poor.  He has lost 40 pounds over the past year having to replace his entire wardrobe.  Wife states that when they went to [place] 1 year ago he was his normal size of the time.  He describes early satiety.  Remote peptic ulcer disease.  No heartburn or dysphagia.  Regular BMs on current regimen.  Complains of significant fatigue.  No abdominal pain.  Takes aspirin daily, no Plavix.  Followed by neurology for multiple strokes.     CT chest, abdomen, pelvis with contrast in April to evaluate abnormal weight loss, stroke formed at outside facility.  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  Indeterminate right adrenal nodule.  No evidence of malignancy.  Current Outpatient Medications  Medication  Sig Dispense Refill  . amLODipine-benazepril (LOTREL) 5-20 MG capsule Take 1 capsule by mouth daily.    Marland Kitchen aspirin 81 MG chewable tablet Chew 81 mg by mouth daily.    Marland Kitchen atenolol (TENORMIN) 25 MG tablet Take 25 mg by mouth daily.    Marland Kitchen atorvastatin (LIPITOR) 80 MG tablet Take 80 mg by mouth See admin instructions. Taking 3 times/week    . dicyclomine (BENTYL) 20 MG tablet Take 20 mg by mouth 2 (two) times daily.    . finasteride (PROSCAR) 5 MG tablet Take 5 mg by mouth at bedtime.  3  . furosemide (LASIX) 20 MG tablet Take 20 mg by mouth See admin instructions. Pt can take 1 tab for 3 days as needed for ankle swelling    . glimepiride (AMARYL) 2 MG tablet Take 2 mg by mouth 2 (two) times daily.    Marland Kitchen LEVEMIR FLEXTOUCH 100 UNIT/ML Pen Inject 28 Units into the skin at bedtime.   1  . lubiprostone (AMITIZA) 24 MCG capsule Take 1 capsule (24 mcg total) by mouth 2 (two) times daily with a meal. 60 capsule 3  . metFORMIN (GLUCOPHAGE) 1000 MG tablet Take 1,000 mg by mouth 2 (two) times daily with a meal.  2  . niacin 500 MG tablet Take 500 mg by mouth daily.   1  . pantoprazole (PROTONIX) 40 MG tablet TAKE 1 TABLET BY MOUTH  2 TIMES DAILY BEFORE A MEAL. (Patient taking differently: Take 40 mg by mouth daily. ) 180 tablet 1  . tadalafil (CIALIS) 20 MG tablet 3 times/day as needed-between meals & bedtime.    . tamsulosin (FLOMAX) 0.4 MG CAPS capsule Take 0.4 mg by mouth at bedtime.  3  . traZODone (DESYREL) 50 MG tablet Take 50 mg by mouth at bedtime.   1   No current facility-administered medications for this visit.     Allergies as of 12/20/2017 - Review Complete 12/20/2017  Allergen Reaction Noted  . Codeine Itching 09/05/2011    Past Medical History:  Diagnosis Date  . Benign prostate hyperplasia   . Diabetes ([place])   . Elevated PSA   . GERD (gastroesophageal reflux disease)   . Hypercholesterolemia   . Impotence of organic origin   . Kidney stone   . Peripheral artery disease ([place])    s/p  stenting  . Skin cancer   . Stroke [facility])    2013 and 2017; 2019  . Urolithiasis     Past Surgical History:  Procedure Laterality Date  . BACK SURGERY  05/2016  . COLONOSCOPY WITH PROPOFOL N/A 11/20/2017   Dr. Gala Romney: Diverticulosis,two 5 to 30 mm polyps in the cecum and rectum.  Cecal polypectomy site tattooed.  Pathology revealed tubular adenoma, no high-grade dysplasia.  Recommend colonoscopy in 6 months.  Marland Kitchen HERNIA REPAIR    . LOOP RECORDER INSERTION  06/27/2017  . PERCUTANEOUS STENT INTERVENTION Left    Left leg for PAD  . POLYPECTOMY  11/20/2017   Procedure: POLYPECTOMY;  Surgeon: Daneil Dolin, MD;  Location: [facility];  Service: Endoscopy;;  cecal  . SHOULDER SURGERY Right     Family History  Problem Relation Age of Onset  . Colon cancer Neg Hx   . Gastric cancer Neg Hx   . Esophageal cancer Neg Hx     Social History   Socioeconomic History  . Marital status: Married    Spouse name: Not on file  . Number of children: Not on file  . Years of education: Not on file  . Highest education level: Not on file  Occupational History  . Not on file  Social Needs  . Financial resource strain: Not on file  . Food insecurity:    Worry: Not on file    Inability: Not on file  . Transportation needs:    Medical: Not on file    Non-medical: Not on file  Tobacco Use  . Smoking status: Current Every Day Smoker    Packs/day: 0.25    Years: 48.00    Pack years: 12.00    Types: Cigarettes    Start date: 03/07/1969  . Smokeless tobacco: Never Used  Substance and Sexual Activity  . Alcohol use: No    Frequency: Never  . Drug use: No  . Sexual activity: Not on file  Lifestyle  . Physical activity:    Days per week: Not on file    Minutes per session: Not on file  . Stress: Not on file  Relationships  . Social connections:    Talks on phone: Not on file    Gets together: Not on file    Attends religious service: Not on file    Active member of club or organization:  Not on file    Attends meetings of clubs or organizations: Not on file    Relationship status: Not on file  . Intimate partner violence:  Fear of current or ex partner: Not on file    Emotionally abused: Not on file    Physically abused: Not on file    Forced sexual activity: Not on file  Other Topics Concern  . Not on file  Social History Narrative  . Not on file      ROS:  General: Negative for fever, chills, fatigue, weakness.  See HPI documented 10 pound weight loss since January 2019 Eyes: Negative for vision changes.  ENT: Negative for hoarseness, difficulty swallowing , nasal congestion. CV: Negative for chest pain, angina, palpitations, dyspnea on exertion, peripheral edema.  Respiratory: Negative for dyspnea at rest, dyspnea on exertion, cough, sputum, wheezing.  GI: See history of present illness. GU:  Negative for dysuria, hematuria, urinary incontinence, urinary frequency, nocturnal urination.  MS: Negative for joint pain, low back pain.  Derm: Negative for rash or itching.  Neuro: Negative for weakness, abnormal sensation, seizure, frequent headaches, memory loss, confusion.  Psych: Negative for anxiety, depression, suicidal ideation, hallucinations.  Endo: See HPI Heme: Negative for bruising or bleeding. Allergy: Negative for rash or hives.    Physical Examination:  BP 122/73   Pulse (!) 111   Temp 98 F (36.7 C) (Oral)   Ht 5\' 9"  (1.753 m)   Wt 159 lb 12.8 oz (72.5 kg)   BMI 23.60 kg/m    General: Well-nourished, well-developed in no acute distress.  Head: Normocephalic, atraumatic.   Eyes: Conjunctiva pink, no icterus. Mouth: Oropharyngeal mucosa moist and pink , no lesions erythema or exudate. Neck: Supple without thyromegaly, masses, or lymphadenopathy.  Lungs: Clear to auscultation bilaterally.  Heart: Regular rate and rhythm, no murmurs rubs or gallops.  Abdomen: Bowel sounds are normal, nontender, nondistended, no hepatosplenomegaly or masses,  no abdominal bruits or    hernia , no rebound or guarding.   Rectal: Not performed Extremities: No lower extremity edema. No clubbing or deformities.  Neuro: Alert and oriented x 4 , grossly normal neurologically.  Skin: Warm and dry, no rash or jaundice.   Psych: Alert and cooperative, normal mood and affect.  Labs: Lab Results  Component Value Date   CREATININE 1.20 11/13/2017   BUN 23 11/13/2017   NA 143 11/13/2017   K 4.2 11/13/2017   CL 108 11/13/2017   CO2 29 11/13/2017    Lab Results  Component Value Date   WBC 11.0 (H) 11/13/2017   HGB 13.3 11/13/2017   HCT 40.0 11/13/2017   MCV 92.6 11/13/2017   PLT 124 (L) 11/13/2017     Imaging Studies: Dg Abd 1 View  Result Date: 11/29/2017 CLINICAL DATA:  Five clips placed in the colon at the time of polypectomy and colonoscopy on 11/20/2017. Pre MRI evaluation. EXAM: ABDOMEN - 1 VIEW COMPARISON:  Abdomen and pelvis CT dated 02/14/2009. FINDINGS: Normal bowel gas pattern. Four metallic clips in the right mid lower abdomen at the lateral aspect of the inferior right colon. There is a single similar clip in the inferior left pelvis. Mild lumbar spine degenerative changes. Atheromatous arterial calcifications. IMPRESSION: Five metallic clips in the abdomen and pelvis, as described above. Correlation with the composition of the clips would be necessary to determine if these are MR safe. Electronically Signed   By: Claudie Revering M.D.   On: 11/29/2017 16:55

## 2017-12-20 NOTE — Patient Instructions (Addendum)
Upper endoscopy in near future. See separate instructions.   Continue pantoprazole daily.  If you have further vomiting with blood or black stools, please let us know.   You will be due another colonoscopy to recheck your colon in 05/2018.

## 2017-12-20 NOTE — H&P (View-Only) (Signed)
Primary Care Physician:  Sherrilee Gilles, DO  Primary Gastroenterologist:  Garfield Cornea, MD   Chief Complaint  Patient presents with  . Emesis    more often since TCS 11/30/17. On wed/sat he was vomiting blood (bright red), 1 episode but was large amount. he went to [facility] in [place], [place] and was diagnosed with upp GI bleed. Was told he ? needed EGD    HPI:  Jack Mcbride is a 67 y.o. male here for further evaluation of hematemesis.  We saw him last back in September at time of colonoscopy to evaluate elevated CEA.  He was noted to have 2 polyps, measuring 5 to 30 mm, tubular adenoma.  One removed piecemeal from the cecum, site tattooed.  Plans for follow-up colonoscopy in 6 months.  Patient has approximately 32-month history of intermittent vomiting associated with nausea.  Occurs at least weekly.  Last Wednesday he vomited what he thought was blood but he was not sure.  Saturday he vomited a large amount of blood, fresh looking.  He went to the urgent care.  He was told his blood count was normal.  He was advised to follow-up with [place].  Last episode of vomiting last night with no blood present.  Does denies melena.  Denies frequent retching.  He notes that his appetite is poor.  He has lost 40 pounds over the past year having to replace his entire wardrobe.  Wife states that when they went to [place] 1 year ago he was his normal size of the time.  He describes early satiety.  Remote peptic ulcer disease.  No heartburn or dysphagia.  Regular BMs on current regimen.  Complains of significant fatigue.  No abdominal pain.  Takes aspirin daily, no Plavix.  Followed by neurology for multiple strokes.     CT chest, abdomen, pelvis with contrast in April to evaluate abnormal weight loss, stroke formed at outside facility.  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  Indeterminate right adrenal nodule.  No evidence of malignancy.  Current Outpatient Medications  Medication  Sig Dispense Refill  . amLODipine-benazepril (LOTREL) 5-20 MG capsule Take 1 capsule by mouth daily.    Marland Kitchen aspirin 81 MG chewable tablet Chew 81 mg by mouth daily.    Marland Kitchen atenolol (TENORMIN) 25 MG tablet Take 25 mg by mouth daily.    Marland Kitchen atorvastatin (LIPITOR) 80 MG tablet Take 80 mg by mouth See admin instructions. Taking 3 times/week    . dicyclomine (BENTYL) 20 MG tablet Take 20 mg by mouth 2 (two) times daily.    . finasteride (PROSCAR) 5 MG tablet Take 5 mg by mouth at bedtime.  3  . furosemide (LASIX) 20 MG tablet Take 20 mg by mouth See admin instructions. Pt can take 1 tab for 3 days as needed for ankle swelling    . glimepiride (AMARYL) 2 MG tablet Take 2 mg by mouth 2 (two) times daily.    Marland Kitchen LEVEMIR FLEXTOUCH 100 UNIT/ML Pen Inject 28 Units into the skin at bedtime.   1  . lubiprostone (AMITIZA) 24 MCG capsule Take 1 capsule (24 mcg total) by mouth 2 (two) times daily with a meal. 60 capsule 3  . metFORMIN (GLUCOPHAGE) 1000 MG tablet Take 1,000 mg by mouth 2 (two) times daily with a meal.  2  . niacin 500 MG tablet Take 500 mg by mouth daily.   1  . pantoprazole (PROTONIX) 40 MG tablet TAKE 1 TABLET BY MOUTH  2 TIMES DAILY BEFORE A MEAL. (Patient taking differently: Take 40 mg by mouth daily. ) 180 tablet 1  . tadalafil (CIALIS) 20 MG tablet 3 times/day as needed-between meals & bedtime.    . tamsulosin (FLOMAX) 0.4 MG CAPS capsule Take 0.4 mg by mouth at bedtime.  3  . traZODone (DESYREL) 50 MG tablet Take 50 mg by mouth at bedtime.   1   No current facility-administered medications for this visit.     Allergies as of 12/20/2017 - Review Complete 12/20/2017  Allergen Reaction Noted  . Codeine Itching 09/05/2011    Past Medical History:  Diagnosis Date  . Benign prostate hyperplasia   . Diabetes ([place])   . Elevated PSA   . GERD (gastroesophageal reflux disease)   . Hypercholesterolemia   . Impotence of organic origin   . Kidney stone   . Peripheral artery disease ([place])    s/p  stenting  . Skin cancer   . Stroke [facility])    2013 and 2017; 2019  . Urolithiasis     Past Surgical History:  Procedure Laterality Date  . BACK SURGERY  05/2016  . COLONOSCOPY WITH PROPOFOL N/A 11/20/2017   Dr. Gala Romney: Diverticulosis,two 5 to 30 mm polyps in the cecum and rectum.  Cecal polypectomy site tattooed.  Pathology revealed tubular adenoma, no high-grade dysplasia.  Recommend colonoscopy in 6 months.  Marland Kitchen HERNIA REPAIR    . LOOP RECORDER INSERTION  06/27/2017  . PERCUTANEOUS STENT INTERVENTION Left    Left leg for PAD  . POLYPECTOMY  11/20/2017   Procedure: POLYPECTOMY;  Surgeon: Daneil Dolin, MD;  Location: [facility];  Service: Endoscopy;;  cecal  . SHOULDER SURGERY Right     Family History  Problem Relation Age of Onset  . Colon cancer Neg Hx   . Gastric cancer Neg Hx   . Esophageal cancer Neg Hx     Social History   Socioeconomic History  . Marital status: Married    Spouse name: Not on file  . Number of children: Not on file  . Years of education: Not on file  . Highest education level: Not on file  Occupational History  . Not on file  Social Needs  . Financial resource strain: Not on file  . Food insecurity:    Worry: Not on file    Inability: Not on file  . Transportation needs:    Medical: Not on file    Non-medical: Not on file  Tobacco Use  . Smoking status: Current Every Day Smoker    Packs/day: 0.25    Years: 48.00    Pack years: 12.00    Types: Cigarettes    Start date: 03/07/1969  . Smokeless tobacco: Never Used  Substance and Sexual Activity  . Alcohol use: No    Frequency: Never  . Drug use: No  . Sexual activity: Not on file  Lifestyle  . Physical activity:    Days per week: Not on file    Minutes per session: Not on file  . Stress: Not on file  Relationships  . Social connections:    Talks on phone: Not on file    Gets together: Not on file    Attends religious service: Not on file    Active member of club or organization:  Not on file    Attends meetings of clubs or organizations: Not on file    Relationship status: Not on file  . Intimate partner violence:  Fear of current or ex partner: Not on file    Emotionally abused: Not on file    Physically abused: Not on file    Forced sexual activity: Not on file  Other Topics Concern  . Not on file  Social History Narrative  . Not on file      ROS:  General: Negative for fever, chills, fatigue, weakness.  See HPI documented 10 pound weight loss since January 2019 Eyes: Negative for vision changes.  ENT: Negative for hoarseness, difficulty swallowing , nasal congestion. CV: Negative for chest pain, angina, palpitations, dyspnea on exertion, peripheral edema.  Respiratory: Negative for dyspnea at rest, dyspnea on exertion, cough, sputum, wheezing.  GI: See history of present illness. GU:  Negative for dysuria, hematuria, urinary incontinence, urinary frequency, nocturnal urination.  MS: Negative for joint pain, low back pain.  Derm: Negative for rash or itching.  Neuro: Negative for weakness, abnormal sensation, seizure, frequent headaches, memory loss, confusion.  Psych: Negative for anxiety, depression, suicidal ideation, hallucinations.  Endo: See HPI Heme: Negative for bruising or bleeding. Allergy: Negative for rash or hives.    Physical Examination:  BP 122/73   Pulse (!) 111   Temp 98 F (36.7 C) (Oral)   Ht 5\' 9"  (1.753 m)   Wt 159 lb 12.8 oz (72.5 kg)   BMI 23.60 kg/m    General: Well-nourished, well-developed in no acute distress.  Head: Normocephalic, atraumatic.   Eyes: Conjunctiva pink, no icterus. Mouth: Oropharyngeal mucosa moist and pink , no lesions erythema or exudate. Neck: Supple without thyromegaly, masses, or lymphadenopathy.  Lungs: Clear to auscultation bilaterally.  Heart: Regular rate and rhythm, no murmurs rubs or gallops.  Abdomen: Bowel sounds are normal, nontender, nondistended, no hepatosplenomegaly or masses,  no abdominal bruits or    hernia , no rebound or guarding.   Rectal: Not performed Extremities: No lower extremity edema. No clubbing or deformities.  Neuro: Alert and oriented x 4 , grossly normal neurologically.  Skin: Warm and dry, no rash or jaundice.   Psych: Alert and cooperative, normal mood and affect.  Labs: Lab Results  Component Value Date   CREATININE 1.20 11/13/2017   BUN 23 11/13/2017   NA 143 11/13/2017   K 4.2 11/13/2017   CL 108 11/13/2017   CO2 29 11/13/2017    Lab Results  Component Value Date   WBC 11.0 (H) 11/13/2017   HGB 13.3 11/13/2017   HCT 40.0 11/13/2017   MCV 92.6 11/13/2017   PLT 124 (L) 11/13/2017     Imaging Studies: Dg Abd 1 View  Result Date: 11/29/2017 CLINICAL DATA:  Five clips placed in the colon at the time of polypectomy and colonoscopy on 11/20/2017. Pre MRI evaluation. EXAM: ABDOMEN - 1 VIEW COMPARISON:  Abdomen and pelvis CT dated 02/14/2009. FINDINGS: Normal bowel gas pattern. Four metallic clips in the right mid lower abdomen at the lateral aspect of the inferior right colon. There is a single similar clip in the inferior left pelvis. Mild lumbar spine degenerative changes. Atheromatous arterial calcifications. IMPRESSION: Five metallic clips in the abdomen and pelvis, as described above. Correlation with the composition of the clips would be necessary to determine if these are MR safe. Electronically Signed   By: Claudie Revering M.D.   On: 11/29/2017 16:55

## 2017-12-21 NOTE — Progress Notes (Signed)
cc'ed to pcp °

## 2017-12-25 ENCOUNTER — Encounter (HOSPITAL_COMMUNITY): Payer: Self-pay

## 2017-12-25 ENCOUNTER — Encounter (HOSPITAL_COMMUNITY)
Admission: RE | Admit: 2017-12-25 | Discharge: 2017-12-25 | Disposition: A | Discharge status: Home / Self Care | Payer: Medicare Other | Source: Ambulatory Visit | Attending: Internal Medicine | Admitting: Internal Medicine

## 2017-12-26 ENCOUNTER — Telehealth: Payer: Self-pay | Admitting: *Deleted

## 2017-12-26 NOTE — Telephone Encounter (Signed)
Spoke with patient. Procedure time on 12/28/17 has now been changed to 12:30pm. Patient aware he can have clear liquids that morning until 8am and nothing after this point. Patient aware he needs to arrive at 11:00am. Jack Mcbride is aware.

## 2017-12-28 ENCOUNTER — Encounter (HOSPITAL_COMMUNITY)
Admission: RE | Disposition: A | Discharge status: Home / Self Care | Payer: Self-pay | Source: Ambulatory Visit | Attending: Internal Medicine

## 2017-12-28 ENCOUNTER — Ambulatory Visit (HOSPITAL_COMMUNITY)
Admission: RE | Admit: 2017-12-28 | Discharge: 2017-12-28 | Disposition: A | Discharge status: Home / Self Care | Payer: Medicare Other | Source: Ambulatory Visit | Attending: Internal Medicine | Admitting: Internal Medicine

## 2017-12-28 ENCOUNTER — Encounter (HOSPITAL_COMMUNITY): Payer: Self-pay | Admitting: *Deleted

## 2017-12-28 ENCOUNTER — Ambulatory Visit (HOSPITAL_COMMUNITY): Payer: Medicare Other | Admitting: Anesthesiology

## 2017-12-28 ENCOUNTER — Other Ambulatory Visit: Payer: Self-pay

## 2017-12-28 DIAGNOSIS — Z794 Long term (current) use of insulin: Secondary | ICD-10-CM | POA: Insufficient documentation

## 2017-12-28 DIAGNOSIS — Z8711 Personal history of peptic ulcer disease: Secondary | ICD-10-CM | POA: Diagnosis not present

## 2017-12-28 DIAGNOSIS — E119 Type 2 diabetes mellitus without complications: Secondary | ICD-10-CM | POA: Diagnosis not present

## 2017-12-28 DIAGNOSIS — R6881 Early satiety: Secondary | ICD-10-CM | POA: Diagnosis not present

## 2017-12-28 DIAGNOSIS — K269 Duodenal ulcer, unspecified as acute or chronic, without hemorrhage or perforation: Secondary | ICD-10-CM

## 2017-12-28 DIAGNOSIS — K264 Chronic or unspecified duodenal ulcer with hemorrhage: Secondary | ICD-10-CM | POA: Diagnosis not present

## 2017-12-28 DIAGNOSIS — K2970 Gastritis, unspecified, without bleeding: Secondary | ICD-10-CM | POA: Diagnosis not present

## 2017-12-28 DIAGNOSIS — K254 Chronic or unspecified gastric ulcer with hemorrhage: Secondary | ICD-10-CM | POA: Insufficient documentation

## 2017-12-28 DIAGNOSIS — Z7982 Long term (current) use of aspirin: Secondary | ICD-10-CM | POA: Insufficient documentation

## 2017-12-28 DIAGNOSIS — F17210 Nicotine dependence, cigarettes, uncomplicated: Secondary | ICD-10-CM | POA: Diagnosis not present

## 2017-12-28 DIAGNOSIS — K219 Gastro-esophageal reflux disease without esophagitis: Secondary | ICD-10-CM | POA: Diagnosis not present

## 2017-12-28 DIAGNOSIS — K3189 Other diseases of stomach and duodenum: Secondary | ICD-10-CM

## 2017-12-28 DIAGNOSIS — R634 Abnormal weight loss: Secondary | ICD-10-CM

## 2017-12-28 DIAGNOSIS — E7800 Pure hypercholesterolemia, unspecified: Secondary | ICD-10-CM | POA: Insufficient documentation

## 2017-12-28 DIAGNOSIS — K439 Ventral hernia without obstruction or gangrene: Secondary | ICD-10-CM | POA: Insufficient documentation

## 2017-12-28 DIAGNOSIS — Z8673 Personal history of transient ischemic attack (TIA), and cerebral infarction without residual deficits: Secondary | ICD-10-CM | POA: Diagnosis not present

## 2017-12-28 DIAGNOSIS — K920 Hematemesis: Secondary | ICD-10-CM

## 2017-12-28 HISTORY — PX: ESOPHAGOGASTRODUODENOSCOPY (EGD) WITH PROPOFOL: SHX5813

## 2017-12-28 HISTORY — PX: BIOPSY: SHX5522

## 2017-12-28 LAB — GLUCOSE, CAPILLARY
Glucose-Capillary: 307 mg/dL — ABNORMAL HIGH (ref 70–99)
Glucose-Capillary: 258 mg/dL — ABNORMAL HIGH (ref 70–99)
Glucose-Capillary: 250 mg/dL — ABNORMAL HIGH (ref 70–99)

## 2017-12-28 SURGERY — ESOPHAGOGASTRODUODENOSCOPY (EGD) WITH PROPOFOL
Anesthesia: General

## 2017-12-28 MED ORDER — CHLORHEXIDINE GLUCONATE CLOTH 2 % EX PADS: 6.0000 | MEDICATED_PAD | Freq: Once | CUTANEOUS | Status: DC

## 2017-12-28 MED ORDER — INSULIN ASPART 100 UNIT/ML ~~LOC~~ SOLN
5.0000 [IU] | Freq: Once | SUBCUTANEOUS | Status: AC
  Administered 2017-12-28: 5 [IU] via SUBCUTANEOUS
  Filled 2017-12-28: qty 0.05

## 2017-12-28 MED ORDER — MIDAZOLAM HCL 2 MG/2ML IJ SOLN: 0.5000 mg | Freq: Once | INTRAMUSCULAR | Status: DC | PRN

## 2017-12-28 MED ORDER — LACTATED RINGERS IV SOLN
INTRAVENOUS | Status: DC
  Administered 2017-12-28: 12:00:00 via INTRAVENOUS

## 2017-12-28 MED ORDER — PROMETHAZINE HCL 25 MG/ML IJ SOLN: 6.2500 mg | INTRAMUSCULAR | Status: DC | PRN

## 2017-12-28 MED ORDER — GLYCOPYRROLATE 0.2 MG/ML IJ SOLN
INTRAMUSCULAR | Status: AC
  Filled 2017-12-28: qty 1

## 2017-12-28 MED ORDER — STERILE WATER FOR IRRIGATION IR SOLN
Status: DC | PRN
  Administered 2017-12-28: 100 mL

## 2017-12-28 MED ORDER — LIDOCAINE HCL (PF) 1 % IJ SOLN
INTRAMUSCULAR | Status: AC
  Filled 2017-12-28: qty 5

## 2017-12-28 MED ORDER — PROPOFOL 500 MG/50ML IV EMUL
INTRAVENOUS | Status: DC | PRN
  Administered 2017-12-28: 150 ug/kg/min via INTRAVENOUS

## 2017-12-28 MED ORDER — PROPOFOL 10 MG/ML IV BOLUS
INTRAVENOUS | Status: DC | PRN
  Administered 2017-12-28: 60 mg via INTRAVENOUS
  Administered 2017-12-28: 20 mg via INTRAVENOUS

## 2017-12-28 MED ORDER — PROPOFOL 10 MG/ML IV BOLUS
INTRAVENOUS | Status: AC
  Filled 2017-12-28: qty 40

## 2017-12-28 MED ORDER — PROPOFOL 10 MG/ML IV BOLUS
INTRAVENOUS | Status: AC
  Filled 2017-12-28: qty 20

## 2017-12-28 NOTE — Interval H&P Note (Signed)
History and Physical Interval Note:  12/28/2017 12:17 PM  Jack Mcbride  has presented today for surgery, with the diagnosis of hematemesis, early satiety, weight loss  The various methods of treatment have been discussed with the patient and family. After consideration of risks, benefits and other options for treatment, the patient has consented to  Procedure(s) with comments: ESOPHAGOGASTRODUODENOSCOPY (EGD) WITH PROPOFOL (N/A) - 3:00pm as a surgical intervention .  The patient's history has been reviewed, patient examined, no change in status, stable for surgery.  I have reviewed the patient's chart and labs.  Questions were answered to the patient's satisfaction.     Jack Mcbride  No change.  No dysphagia.  EGD per plan. The risks, benefits, limitations, alternatives and imponderables have been reviewed with the patient. Potential for esophageal dilation, biopsy, etc. have also been reviewed.  Questions have been answered. All parties agreeable.

## 2017-12-28 NOTE — Anesthesia Procedure Notes (Signed)
Procedure Name: MAC Date/Time: 12/28/2017 12:21 PM Performed by: Vista Deck, CRNA Pre-anesthesia Checklist: Patient identified, Emergency Drugs available, Suction available, Timeout performed and Patient being monitored Patient Re-evaluated:Patient Re-evaluated prior to induction Oxygen Delivery Method: Non-rebreather mask

## 2017-12-28 NOTE — Op Note (Signed)
[facility] Patient Name: Jack Mcbride Procedure Date: 12/28/2017 12:23 PM MRN: [id number] Date of Birth: 1950/09/01 Attending MD: Norvel Richards , MD CSN: [id number] Age: 67 Admit Type: Outpatient Procedure:                Upper GI endoscopy Indications:              Hematemesis; wt loss Providers:                Norvel Richards, MD, Rosina Lowenstein, RN, Nelma Rothman, Technician Referring MD:             Sherrilee Gilles Medicines:                Propofol per Anesthesia Complications:            No immediate complications. Estimated Blood Loss:     Estimated blood loss was minimal. Procedure:                Pre-Anesthesia Assessment:                           - Prior to the procedure, a History and Physical                            was performed, and patient medications and                            allergies were reviewed. The patient's tolerance of                            previous anesthesia was also reviewed. The risks                            and benefits of the procedure and the sedation                            options and risks were discussed with the patient.                            All questions were answered, and informed consent                            was obtained. Prior Anticoagulants: The patient has                            taken no previous anticoagulant or antiplatelet                            agents. ASA Grade Assessment: II - A patient with                            mild systemic disease. After reviewing the risks  and benefits, the patient was deemed in                            satisfactory condition to undergo the procedure.                           After obtaining informed consent, the endoscope was                            passed under direct vision. Throughout the                            procedure, the patient's blood pressure, pulse, and   oxygen saturations were monitored continuously. The                            GIF-H190 ([id number]) scope was introduced through the                            and advanced to the second part of duodenum. The                            upper GI endoscopy was accomplished without                            difficulty. The patient tolerated the procedure                            well. Scope In: 12:29:00 PM Scope Out: 12:33:48 PM Total Procedure Duration: 0 hours 4 minutes 48 seconds  Findings:      The examined esophagus was normal.      Multiple dispersed erosions were found in the gastric antrum. No ulcer       or infiltrating process seen.      Multiple diffuse erosions without bleeding were found in the duodenal       bulb. Otherwise,the duodenum through the second portion appeared normal.       Abnormal gastric mucosa biopsied. Impression:               - Normal esophagus.                           - Erosive gastropathy.                           - Duodenal erosions without bleeding.                           -Status post gastric biopsy. Moderate Sedation:      Moderate (conscious) sedation was personally administered by an       anesthesia professional. The following parameters were monitored: oxygen       saturation, heart rate, blood pressure, respiratory rate, EKG, adequacy       of pulmonary ventilation, and response to care. Recommendation:           - Patient has a contact number available for  emergencies. The signs and symptoms of potential                            delayed complications were discussed with the                            patient. Return to normal activities tomorrow.                            Written discharge instructions were provided to the                            patient.                           - Resume previous diet.                           - Continue present medications. Further                             recommendations to follow pending review of                            pathology report. Further evaluation for weight                            loss including mesenteric ischemia may be needed in                            the Procedure Code(s):        --- Professional ---                           [id number], Esophagogastroduodenoscopy, flexible,                            transoral; diagnostic, including collection of                            specimen(s) by brushing or washing, when performed                            (separate procedure) Diagnosis Code(s):        --- Professional ---                           K31.89, Other diseases of stomach and duodenum                           K26.9, Duodenal ulcer, unspecified as acute or                            chronic, without hemorrhage or perforation                           K92.0, Hematemesis  CPT copyright 2018 American Medical Association. All rights reserved. The codes documented in this report are preliminary and upon coder review may  be revised to meet current compliance requirements. Cristopher Estimable. Lenora Gomes, MD Norvel Richards, MD 12/28/2017 12:57:50 PM This report has been signed electronically. Number of Addenda: 0

## 2017-12-28 NOTE — Transfer of Care (Signed)
Immediate Anesthesia Transfer of Care Note  Patient: Jack Mcbride  Procedure(s) Performed: ESOPHAGOGASTRODUODENOSCOPY (EGD) WITH PROPOFOL (N/A ) BIOPSY  Patient Location: PACU  Anesthesia Type:General  Level of Consciousness: awake and patient cooperative  Airway & Oxygen Therapy: Patient Spontanous Breathing  Post-op Assessment: Report given to RN and Post -op Vital signs reviewed and stable  Post vital signs: Reviewed and stable  Last Vitals:  Vitals Value Taken Time  BP 128/67 12/28/2017 12:42 PM  Temp    Pulse 73 12/28/2017 12:43 PM  Resp 21 12/28/2017 12:43 PM  SpO2 97 % 12/28/2017 12:43 PM  Vitals shown include unvalidated device data.  Last Pain:  Vitals:   12/28/17 1237  PainSc: 0-No pain      Patients Stated Pain Goal: 8 (73/75/05 1071)  Complications: No apparent anesthesia complications

## 2017-12-28 NOTE — Discharge Instructions (Signed)
EGD Discharge instructions Please read the instructions outlined below and refer to this sheet in the next few weeks. These discharge instructions provide you with general information on caring for yourself after you leave the hospital. Your doctor may also give you specific instructions. While your treatment has been planned according to the most current medical practices available, unavoidable complications occasionally occur. If you have any problems or questions after discharge, please call your doctor. ACTIVITY  You may resume your regular activity but move at a slower pace for the next 24 hours.   Take frequent rest periods for the next 24 hours.   Walking will help expel (get rid of) the air and reduce the bloated feeling in your abdomen.   No driving for 24 hours (because of the anesthesia (medicine) used during the test).   You may shower.   Do not sign any important legal documents or operate any machinery for 24 hours (because of the anesthesia used during the test).  NUTRITION  Drink plenty of fluids.   You may resume your normal diet.   Begin with a light meal and progress to your normal diet.   Avoid alcoholic beverages for 24 hours or as instructed by your caregiver.  MEDICATIONS  You may resume your normal medications unless your caregiver tells you otherwise.  WHAT YOU CAN EXPECT TODAY  You may experience abdominal discomfort such as a feeling of fullness or gas pains.  FOLLOW-UP  Your doctor will discuss the results of your test with you.  SEEK IMMEDIATE MEDICAL ATTENTION IF ANY OF THE FOLLOWING OCCUR:  Excessive nausea (feeling sick to your stomach) and/or vomiting.   Severe abdominal pain and distention (swelling).   Trouble swallowing.   Temperature over 101 F (37.8 C).   Rectal bleeding or vomiting of blood.   Further recommendations to follow pending review of pathology report  Further testing may be needed.   Monitored Anesthesia  Care Anesthesia is a term that refers to techniques, procedures, and medicines that help a person stay safe and comfortable during a medical procedure. Monitored anesthesia care, or sedation, is one type of anesthesia. Your anesthesia specialist may recommend sedation if you will be having a procedure that does not require you to be unconscious, such as:  Cataract surgery.  A dental procedure.  A biopsy.  A colonoscopy.  During the procedure, you may receive a medicine to help you relax (sedative). There are three levels of sedation:  Mild sedation. At this level, you may feel awake and relaxed. You will be able to follow directions.  Moderate sedation. At this level, you will be sleepy. You may not remember the procedure.  Deep sedation. At this level, you will be asleep. You will not remember the procedure.  The more medicine you are given, the deeper your level of sedation will be. Depending on how you respond to the procedure, the anesthesia specialist may change your level of sedation or the type of anesthesia to fit your needs. An anesthesia specialist will monitor you closely during the procedure. Let your health care provider know about:  Any allergies you have.  All medicines you are taking, including vitamins, herbs, eye drops, creams, and over-the-counter medicines.  Any use of steroids (by mouth or as a cream).  Any problems you or family members have had with sedatives and anesthetic medicines.  Any blood disorders you have.  Any surgeries you have had.  Any medical conditions you have, such as sleep apnea.  Whether  you are pregnant or may be pregnant.  Any use of cigarettes, alcohol, or street drugs. What are the risks? Generally, this is a safe procedure. However, problems may occur, including:  Getting too much medicine (oversedation).  Nausea.  Allergic reaction to medicines.  Trouble breathing. If this happens, a breathing tube may be used to help  with breathing. It will be removed when you are awake and breathing on your own.  Heart trouble.  Lung trouble.  Before the procedure Staying hydrated Follow instructions from your health care provider about hydration, which may include:  Up to 2 hours before the procedure - you may continue to drink clear liquids, such as water, clear fruit juice, black coffee, and plain tea.  Eating and drinking restrictions Follow instructions from your health care provider about eating and drinking, which may include:  8 hours before the procedure - stop eating heavy meals or foods such as meat, fried foods, or fatty foods.  6 hours before the procedure - stop eating light meals or foods, such as toast or cereal.  6 hours before the procedure - stop drinking milk or drinks that contain milk.  2 hours before the procedure - stop drinking clear liquids.  Medicines Ask your health care provider about:  Changing or stopping your regular medicines. This is especially important if you are taking diabetes medicines or blood thinners.  Taking medicines such as aspirin and ibuprofen. These medicines can thin your blood. Do not take these medicines before your procedure if your health care provider instructs you not to.  Tests and exams  You will have a physical exam.  You may have blood tests done to show: ? How well your kidneys and liver are working. ? How well your blood can clot.  General instructions  Plan to have someone take you home from the hospital or clinic.  If you will be going home right after the procedure, plan to have someone with you for 24 hours.  What happens during the procedure?  Your blood pressure, heart rate, breathing, level of pain and overall condition will be monitored.  An IV tube will be inserted into one of your veins.  Your anesthesia specialist will give you medicines as needed to keep you comfortable during the procedure. This may mean changing the level  of sedation.  The procedure will be performed. After the procedure  Your blood pressure, heart rate, breathing rate, and blood oxygen level will be monitored until the medicines you were given have worn off.  Do not drive for 24 hours if you received a sedative.  You may: ? Feel sleepy, clumsy, or nauseous. ? Feel forgetful about what happened after the procedure. ? Have a sore throat if you had a breathing tube during the procedure. ? Vomit. This information is not intended to replace advice given to you by your health care provider. Make sure you discuss any questions you have with your health care provider. Document Released: 11/17/2004 Document Revised: 07/31/2015 Document Reviewed: 06/14/2015 Elsevier Interactive Patient Education  [employer].

## 2017-12-28 NOTE — Anesthesia Postprocedure Evaluation (Signed)
Anesthesia Post Note  Patient: Jack Mcbride  Procedure(s) Performed: ESOPHAGOGASTRODUODENOSCOPY (EGD) WITH PROPOFOL (N/A ) BIOPSY  Patient location during evaluation: PACU Anesthesia Type: General Level of consciousness: awake and alert and patient cooperative Pain management: satisfactory to patient Vital Signs Assessment: post-procedure vital signs reviewed and stable Respiratory status: spontaneous breathing Cardiovascular status: stable Postop Assessment: no apparent nausea or vomiting Anesthetic complications: no     Last Vitals:  Vitals:   12/28/17 1245 12/28/17 1300  BP: 117/68 (!) 142/71  Pulse: 65 68  Resp: 20 20  Temp:    SpO2: 97% 98%    Last Pain:  Vitals:   12/28/17 1300  PainSc: 0-No pain                 Atira Borello

## 2017-12-28 NOTE — Interval H&P Note (Signed)
History and Physical Interval Note:  12/28/2017 12:14 PM  Ward [name]  has presented today for surgery, with the diagnosis of hematemesis, early satiety, weight loss  The various methods of treatment have been discussed with the patient and family. After consideration of risks, benefits and other options for treatment, the patient has consented to  Procedure(s) with comments: ESOPHAGOGASTRODUODENOSCOPY (EGD) WITH PROPOFOL (N/A) - 3:00pm as a surgical intervention .  The patient's history has been reviewed, patient examined, no change in status, stable for surgery.  I have reviewed the patient's chart and labs.  Questions were answered to the patient's satisfaction.     Jack Mcbride  No change.  No dysphagia.  EGD per plan.  The risks, benefits, limitations, alternatives and imponderables have been reviewed with the patient. Potential for esophageal dilation, biopsy, etc. have also been reviewed.  Questions have been answered. All parties agreeable.

## 2017-12-28 NOTE — Anesthesia Preprocedure Evaluation (Signed)
Anesthesia Evaluation  Patient identified by MRN, date of birth, ID band Patient awake    Reviewed: Allergy & Precautions, NPO status , Patient's Chart, lab work & pertinent test results  Airway Mallampati: II  TM Distance: >3 FB Neck ROM: Full    Dental no notable dental hx. (+) Teeth Intact   Pulmonary neg pulmonary ROS, Current Smoker,    Pulmonary exam normal breath sounds clear to auscultation       Cardiovascular Exercise Tolerance: Good + Peripheral Vascular Disease  Normal cardiovascular examI Rhythm:Regular Rate:Normal     Neuro/Psych CVA, Residual Symptoms negative psych ROS   GI/Hepatic Neg liver ROS, GERD  Medicated and Controlled,  Endo/Other  negative endocrine ROSdiabetes, Poorly Controlled  Renal/GU Renal disease  negative genitourinary   Musculoskeletal negative musculoskeletal ROS (+)   Abdominal   Peds negative pediatric ROS (+)  Hematology negative hematology ROS (+)   Anesthesia Other Findings   Reproductive/Obstetrics negative OB ROS                             Anesthesia Physical Anesthesia Plan  ASA: III  Anesthesia Plan: General   Post-op Pain Management:    Induction: Intravenous  PONV Risk Score and Plan:   Airway Management Planned: Nasal Cannula and Simple Face Mask  Additional Equipment:   Intra-op Plan:   Post-operative Plan: Extubation in OR  Informed Consent: I have reviewed the patients History and Physical, chart, labs and discussed the procedure including the risks, benefits and alternatives for the proposed anesthesia with the patient or authorized representative who has indicated his/her understanding and acceptance.   Dental advisory given  Plan Discussed with: CRNA  Anesthesia Plan Comments:         Anesthesia Quick Evaluation

## 2018-01-03 ENCOUNTER — Encounter (HOSPITAL_COMMUNITY): Payer: Self-pay | Admitting: Internal Medicine

## 2018-01-05 ENCOUNTER — Encounter: Payer: Self-pay | Admitting: Internal Medicine

## 2018-01-08 ENCOUNTER — Telehealth: Payer: Self-pay

## 2018-01-08 DIAGNOSIS — R634 Abnormal weight loss: Secondary | ICD-10-CM

## 2018-01-08 NOTE — Telephone Encounter (Signed)
Noted. Pt is aware that he will receive a letter and a nurse from our office will call him to schedule Cat Scan.   Please schedule Cat Scan per RMR.

## 2018-01-08 NOTE — Telephone Encounter (Signed)
Jack Mcbride at [facility] CT advised to order CT angio abd/pelvis. CT scheduled for 01/25/18 at 11:00am, arrive at 10:45am. Liquids only for 4 hours prior to test. Will need creatinine before appt.  Called and informed pt's spouse of CT appt. Creatinine order entered and released for [facility]. Wife is aware. Appt letter mailed.

## 2018-01-08 NOTE — Telephone Encounter (Signed)
Per RMR-  Send letter to patient.  Send copy of letter with path to referring provider and PCP.   Lets schedule a CT PE of the abdomen to look for mesenteric ischemia. he's had progressive weight loss.   Office visit with [place] in 3 months

## 2018-01-09 NOTE — Telephone Encounter (Signed)
PATIENT SCHEDULED  °

## 2018-01-12 LAB — CREATININE, SERUM: CREATININE: 1.11 mg/dL (ref 0.70–1.25)

## 2018-01-25 ENCOUNTER — Ambulatory Visit (HOSPITAL_COMMUNITY)
Admission: RE | Admit: 2018-01-25 | Discharge: 2018-01-25 | Disposition: A | Discharge status: Home / Self Care | Payer: Medicare Other | Source: Ambulatory Visit | Attending: Internal Medicine | Admitting: Internal Medicine

## 2018-01-25 DIAGNOSIS — R634 Abnormal weight loss: Secondary | ICD-10-CM | POA: Insufficient documentation

## 2018-01-25 DIAGNOSIS — N400 Enlarged prostate without lower urinary tract symptoms: Secondary | ICD-10-CM | POA: Insufficient documentation

## 2018-01-25 DIAGNOSIS — K439 Ventral hernia without obstruction or gangrene: Secondary | ICD-10-CM | POA: Insufficient documentation

## 2018-01-25 DIAGNOSIS — M5137 Other intervertebral disc degeneration, lumbosacral region: Secondary | ICD-10-CM | POA: Insufficient documentation

## 2018-01-25 MED ORDER — IOPAMIDOL (ISOVUE-370) INJECTION 76%
100.0000 mL | Freq: Once | INTRAVENOUS | Status: AC | PRN
  Administered 2018-01-25: 100 mL via INTRAVENOUS

## 2018-02-22 ENCOUNTER — Telehealth: Payer: Self-pay

## 2018-02-22 NOTE — Telephone Encounter (Signed)
Spouse called to discuss pts CT results. She remembered most of it but wanted clarity. She is aware that pt needs to contact his PCP about the the enlarged prostate and narrowed blood vessels to the lt kidney. Spouse said he see's [facility] for the enlarged prostate. She was still advised to contact his doctors to let them know. Pt is scheduled to see RMR on 03/20/17. See CT note under imaging.

## 2018-03-19 ENCOUNTER — Ambulatory Visit (INDEPENDENT_AMBULATORY_CARE_PROVIDER_SITE_OTHER): Payer: Medicare Other | Admitting: Gastroenterology

## 2018-03-19 ENCOUNTER — Other Ambulatory Visit: Payer: Self-pay | Admitting: *Deleted

## 2018-03-19 ENCOUNTER — Encounter: Payer: Self-pay | Admitting: Gastroenterology

## 2018-03-19 VITALS — BP 135/76 | HR 92 | Temp 97.8°F | Ht 69.0 in | Wt 158.8 lb

## 2018-03-19 DIAGNOSIS — I771 Stricture of artery: Secondary | ICD-10-CM

## 2018-03-19 DIAGNOSIS — K5900 Constipation, unspecified: Secondary | ICD-10-CM

## 2018-03-19 DIAGNOSIS — I701 Atherosclerosis of renal artery: Secondary | ICD-10-CM

## 2018-03-19 DIAGNOSIS — R630 Anorexia: Secondary | ICD-10-CM

## 2018-03-19 DIAGNOSIS — I70219 Atherosclerosis of native arteries of extremities with intermittent claudication, unspecified extremity: Secondary | ICD-10-CM | POA: Insufficient documentation

## 2018-03-19 DIAGNOSIS — K219 Gastro-esophageal reflux disease without esophagitis: Secondary | ICD-10-CM

## 2018-03-19 DIAGNOSIS — D126 Benign neoplasm of colon, unspecified: Secondary | ICD-10-CM

## 2018-03-19 DIAGNOSIS — R634 Abnormal weight loss: Secondary | ICD-10-CM | POA: Diagnosis not present

## 2018-03-19 MED ORDER — PANTOPRAZOLE SODIUM 40 MG PO TBEC: DELAYED_RELEASE_TABLET | ORAL | 0 refills | Status: DC

## 2018-03-19 NOTE — Patient Instructions (Signed)
1. Please have your labs done when you see your PCP. Please ask they send copy of labs to [place] at [phone] (fax).  2. Colonoscopy in April. See separate instructions.  3. Increase pantoprazole to twice daily before a meal for the next four weeks. If your heartburn, nausea and appetite improve, then continue, otherwise you can reduce back to once daily.  4. We will work on referral to vascular regarding right renal artery stenosis and right and left common iliac artery abnormalities seen on CT.

## 2018-03-19 NOTE — Progress Notes (Signed)
Primary Care Physician: Sherrilee Gilles, DO  Primary Gastroenterologist:  Garfield Cornea, MD   Chief Complaint  Patient presents with  . Weight Loss    CT f/u    HPI: Jack Mcbride is a 68 y.o. male here for follow-up.  He was seen back in October for hematemesis.  He had had a colonoscopy in September 2019 to evaluate elevated CEA.  Noted to have 2 polyps, measuring 5 to 30 mm, tubular adenoma.  One removed piecemeal from the cecum, site tattooed.  Plans for 68-month follow-up.  Patient with greater than 80-month history of intermittent vomiting associated with nausea occurring at least weekly.  Prior to his last office visit in 12/2017 he had an episode of what he felt like was hematemesis.  Few days later he had a large amount of fresh blood in the emesis and he went to urgent care.  He had reported a 40 pound weight loss over the past couple of years having to replace his entire wardrobe.  EGD December 28, 2017 showed erosive gastropathy, duodenal erosions without bleeding.  Gastric biopsy benign, no H. Pylori.  He had a CTA abdomen and pelvis on January 25, 2018 to further evaluate weight loss.  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  No evidence of chronic mesenteric ischemia.  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  On the left, this results in a focal moderate high-grade stenosis.  Midline ventral abdominal hernia containing a single loop of small bowel without evidence of wall thickening, obstruction or inflammation.  Prostatomegaly with irregular central enhancement.  In the past one year we have documented 12 pound weight loss and weight has been stable for past 3 months. Patient denies left buttock/thigh pain with ambulation. He reports some right sided leg pain since his stroke.   Jack Mcbride remains significantly concerned. Reports patient is tired/fatigue and can't do the things  he wants to do. Seems to be worse since CVA in 06/2017. She feels he doesn't eat enough. He reports his reflux is poorly controlled especially in the evenings. His vomiting is much improved, last episode over one month ago. He takes pantoprazole once daily before lunch. Doesn't get up early enough for breakfast. Denies dysphagia. Appetite remains poor. BM daily. No melena, brbpr.   He reports he saw his urologist recently and did mention his CT regarding the prostate. He is in process of getting films to review.    Current Outpatient Medications  Medication Sig Dispense Refill  . amLODipine-benazepril (LOTREL) 5-20 MG capsule Take 1 capsule by mouth daily.    Marland Kitchen aspirin 81 MG tablet Take 81 mg by mouth daily.     Marland Kitchen atenolol (TENORMIN) 25 MG tablet Take 25 mg by mouth daily.    Marland Kitchen atorvastatin (LIPITOR) 80 MG tablet Take 80 mg by mouth 3 (three) times a week. Taking 3 times weekly    . cyclobenzaprine (FLEXERIL) 10 MG tablet Take 10 mg by mouth at bedtime.  2  . finasteride (PROSCAR) 5 MG tablet Take 5 mg by mouth at bedtime.  3  . furosemide (LASIX) 20 MG tablet Take 20 mg by mouth daily as needed for edema. Pt can take 1 tab for 3 days as needed for ankle swelling     . glimepiride (AMARYL) 2 MG tablet Take 2 mg by mouth 2 (two) times daily.    Marland Kitchen LEVEMIR FLEXTOUCH 100 UNIT/ML  Pen Inject 27 Units into the skin at bedtime.   1  . lubiprostone (AMITIZA) 24 MCG capsule Take 1 capsule (24 mcg total) by mouth 2 (two) times daily with a meal. 60 capsule 3  . metFORMIN (GLUCOPHAGE) 1000 MG tablet Take 1,000 mg by mouth 2 (two) times daily with a meal.  2  . niacin 500 MG tablet Take 500 mg by mouth daily.   1  . pantoprazole (PROTONIX) 40 MG tablet TAKE 1 TABLET BY MOUTH 2 TIMES DAILY BEFORE A MEAL. (Patient taking differently: Take 40 mg by mouth daily. ) 180 tablet 1  . tadalafil (CIALIS) 20 MG tablet Take 20 mg by mouth daily as needed for erectile dysfunction.     . tamsulosin (FLOMAX) 0.4 MG CAPS  capsule Take 0.4 mg by mouth at bedtime.  3  . traZODone (DESYREL) 50 MG tablet Take 50 mg by mouth at bedtime.   1  . dicyclomine (BENTYL) 20 MG tablet Take 20 mg by mouth 2 (two) times daily.     No current facility-administered medications for this visit.     Allergies as of 03/19/2018 - Review Complete 03/19/2018  Allergen Reaction Noted  . Codeine Itching 09/05/2011   Past Medical History:  Diagnosis Date  . Benign prostate hyperplasia   . Diabetes ([place])   . Elevated PSA   . GERD (gastroesophageal reflux disease)   . Hypercholesterolemia   . Impotence of organic origin   . Kidney stone   . Peripheral artery disease ([place])    s/p stenting  . Skin cancer   . Stroke [facility])    2013 and 2017; 2019  . Urolithiasis    Past Surgical History:  Procedure Laterality Date  . BACK SURGERY  05/2016  . BIOPSY  12/28/2017   Procedure: BIOPSY;  Surgeon: Daneil Dolin, MD;  Location: [facility];  Service: Endoscopy;;  gastric bx's  . COLONOSCOPY WITH PROPOFOL N/A 11/20/2017   Dr. Gala Romney: Diverticulosis,two 5 to 30 mm polyps in the cecum and rectum.  Cecal polypectomy site tattooed.  Pathology revealed tubular adenoma, no high-grade dysplasia.  Recommend colonoscopy in 6 months.  . ESOPHAGOGASTRODUODENOSCOPY (EGD) WITH PROPOFOL N/A 12/28/2017   Dr. Gala Romney: Normal esophagus, erosive gastropathy, duodenal erosions without bleeding.  Gastric biopsy benign.  No H. pylori on biopsy.  Marland Kitchen HERNIA REPAIR    . LOOP RECORDER INSERTION  06/27/2017  . PERCUTANEOUS STENT INTERVENTION Left    Left leg for PAD  . POLYPECTOMY  11/20/2017   Procedure: POLYPECTOMY;  Surgeon: Daneil Dolin, MD;  Location: [facility];  Service: Endoscopy;;  cecal  . SHOULDER SURGERY Right    Family History  Problem Relation Age of Onset  . Heart attack Mother   . Diabetes Father   . Colon cancer Neg Hx   . Gastric cancer Neg Hx   . Esophageal cancer Neg Hx    Social History   Tobacco Use  . Smoking status:  Current Every Day Smoker    Packs/day: 0.25    Years: 48.00    Pack years: 12.00    Types: Cigarettes    Start date: 03/07/1969  . Smokeless tobacco: Never Used  Substance Use Topics  . Alcohol use: No    Frequency: Never  . Drug use: No     ROS:  General: Negative for anorexia, weight loss, fever, chills. See hpi. ENT: Negative for hoarseness, difficulty swallowing , nasal congestion. CV: Negative for chest pain, angina, palpitations, dyspnea on  exertion, peripheral edema.  Respiratory: Negative for dyspnea at rest, dyspnea on exertion, cough, sputum, wheezing.  GI: See history of present illness. GU:  Negative for dysuria, hematuria, urinary incontinence, urinary frequency, nocturnal urination.  Endo: Negative for unusual weight change.    Physical Examination:   BP 135/76   Pulse 92   Temp 97.8 F (36.6 C) (Oral)   Ht 5\' 9"  (1.753 m)   Wt 158 lb 12.8 oz (72 kg)   BMI 23.45 kg/m   General: Well-nourished, well-developed in no acute distress. Limited eye contact. Appears frustrated with Jack Mcbride when she speaks of his symptoms and problems.  Eyes: No icterus. Mouth: Oropharyngeal mucosa moist and pink , no lesions erythema or exudate. Lungs: Clear to auscultation bilaterally.  Heart: Regular rate and rhythm, no murmurs rubs or gallops.  Abdomen: Bowel sounds are normal, nontender, nondistended, no hepatosplenomegaly or masses, no abdominal bruits or hernia , no rebound or guarding.   Extremities: No lower extremity edema. No clubbing or deformities. Neuro: Alert and oriented x 4   Skin: Warm and dry, no jaundice.   Psych: Alert and cooperative. Flat affect.  Labs:  Lab Results  Component Value Date   CREATININE 1.11 01/12/2018   BUN 23 11/13/2017   NA 143 11/13/2017   K 4.2 11/13/2017   CL 108 11/13/2017   CO2 29 11/13/2017    Lab Results  Component Value Date   WBC 11.0 (H) 11/13/2017   HGB 13.3 11/13/2017   HCT 40.0 11/13/2017   MCV 92.6 11/13/2017   PLT  124 (L) 11/13/2017    Imaging Studies: No results found.   Impression/Plan: 68 year old male presenting for follow-up.  Gives a history of 40 pound weight loss in the past couple of years, no documented weight loss in the past 3 months.  Work-up to date includes colonoscopy, EGD, CTA abdomen/pelvis.  He had large tubular adenoma of the cecum, piecemeal removal, due for surveillance colonoscopy in March.  Patient would like to postpone to April, we will get him scheduled in the next couple of weeks.  Plan for deep sedation  I have discussed the risks, alternatives, benefits with regards to but not limited to the risk of reaction to medication, bleeding, infection, perforation and the patient is agreeable to proceed. Written consent to be obtained.  He also had an upper endoscopy to evaluate for hematemesis, early satiety, poor appetite.  He had erosive gastropathy, H. pylori negative.  Continues to have poor appetite.  Fortunately vomiting seems to have resolved at this point.  Continues to have significant breakthrough reflux symptoms.  We will increase his pantoprazole to 40 mg twice daily before meals for at least the next 4 weeks to see how this does for his reflux symptoms as well as appetite concerns. Cannot exclude underlying diabetic gastroparesis.   He had CTA abdomen pelvis to rule out mesenteric ischemia.  Mesenteric vascular actually looked pretty good.  He was noted to have moderate right renal artery stenosis as well as bilateral common iliac artery abnormalities/penetrating ulcerations with dissections and with what appear to be significant stenosis of the left common iliac artery.  Plans to refer to vascular for further management.   Abnormal prostate on CTA with extensive history of multiple prostate biopsies in the past followed closely by urology.   He sees his PCP next month.  We will plan to update labs at that time.  Plan to update CBC, check thyroid function, Cmet.   I will  review  case with Dr. Gala Romney regarding any further recommendations.

## 2018-03-20 ENCOUNTER — Ambulatory Visit: Payer: Medicare Other | Admitting: Internal Medicine

## 2018-03-20 ENCOUNTER — Other Ambulatory Visit: Payer: Self-pay | Admitting: *Deleted

## 2018-03-20 ENCOUNTER — Telehealth: Payer: Self-pay | Admitting: *Deleted

## 2018-03-20 DIAGNOSIS — R634 Abnormal weight loss: Secondary | ICD-10-CM

## 2018-03-20 DIAGNOSIS — Z86010 Personal history of colonic polyps: Secondary | ICD-10-CM

## 2018-03-20 MED ORDER — NA SULFATE-K SULFATE-MG SULF 17.5-3.13-1.6 GM/177ML PO SOLN: 1.0000 | ORAL | 0 refills | Status: DC

## 2018-03-20 NOTE — Telephone Encounter (Signed)
Patient spouse called to schedule procedure. He is scheduled for 06/14/2018 at 9:45am. Instructions will be mailed with pre-op appt. I advised will send Rx to the [facility] in [place].

## 2018-03-20 NOTE — Progress Notes (Signed)
CC'D TO PCP °

## 2018-03-20 NOTE — Telephone Encounter (Signed)
LMOVM to schedule TCS W/ MAC W/ RMR in April 2020

## 2018-03-21 NOTE — Telephone Encounter (Signed)
Pre-op scheduled for 06/07/2018 at 10:00am. Letter mailed with instructions.

## 2018-04-03 ENCOUNTER — Ambulatory Visit: Payer: Medicare Other | Admitting: Nurse Practitioner

## 2018-04-12 ENCOUNTER — Encounter (INDEPENDENT_AMBULATORY_CARE_PROVIDER_SITE_OTHER): Payer: Medicare Other | Admitting: Vascular Surgery

## 2018-04-22 NOTE — Progress Notes (Signed)
Discussed with Dr. Gala Romney who agrees with assessment and plan. Colonoscopy as scheduled. Optimize gerd treatment.

## 2018-05-07 ENCOUNTER — Other Ambulatory Visit: Payer: Self-pay

## 2018-05-07 ENCOUNTER — Ambulatory Visit (INDEPENDENT_AMBULATORY_CARE_PROVIDER_SITE_OTHER): Payer: Medicare Other | Admitting: Vascular Surgery

## 2018-05-07 ENCOUNTER — Encounter (INDEPENDENT_AMBULATORY_CARE_PROVIDER_SITE_OTHER): Payer: Self-pay | Admitting: Vascular Surgery

## 2018-05-07 VITALS — BP 144/70 | HR 70 | Resp 12 | Ht 69.0 in | Wt 157.0 lb

## 2018-05-07 DIAGNOSIS — E782 Mixed hyperlipidemia: Secondary | ICD-10-CM

## 2018-05-07 DIAGNOSIS — I701 Atherosclerosis of renal artery: Secondary | ICD-10-CM | POA: Diagnosis not present

## 2018-05-07 DIAGNOSIS — E785 Hyperlipidemia, unspecified: Secondary | ICD-10-CM | POA: Insufficient documentation

## 2018-05-07 DIAGNOSIS — E119 Type 2 diabetes mellitus without complications: Secondary | ICD-10-CM | POA: Insufficient documentation

## 2018-05-07 DIAGNOSIS — Z87891 Personal history of nicotine dependence: Secondary | ICD-10-CM

## 2018-05-07 DIAGNOSIS — E1151 Type 2 diabetes mellitus with diabetic peripheral angiopathy without gangrene: Secondary | ICD-10-CM | POA: Diagnosis not present

## 2018-05-07 DIAGNOSIS — I70213 Atherosclerosis of native arteries of extremities with intermittent claudication, bilateral legs: Secondary | ICD-10-CM

## 2018-05-07 DIAGNOSIS — I10 Essential (primary) hypertension: Secondary | ICD-10-CM | POA: Diagnosis not present

## 2018-05-07 NOTE — Progress Notes (Signed)
MRN : [id number]  Jack Mcbride is a 68 y.o. (1950-12-10) male who presents with chief complaint of  Chief Complaint  Patient presents with  . New Patient (Initial Visit)  .  History of Present Illness:   The patient is seen for evaluation of painful lower extremities, left greater than right as well as right renal artery stenosis.  Patient notes the pain is variable and always worse with activity.  The pain is somewhat consistent day to day occurring on most days. The patient notes the pain does not really occur with standing. The pain has been progressive over the past several years. The patient states these symptoms are causing  a profound negative impact on quality of life and daily activities.  The patient states that his left leg hurts worst in the morning when he first gets up but he denies dangling of an extremity off the side of the bed during the night for relief.  No open wounds or sores at this time.  The patient is also seen for evaluation of renal artery stenosis incidentally found on CT scan.  He does admit to difficult to control  hypertension. The patient has a long history of hypertension which recently has become increasingly difficult to control utilizing medical therapy. The patient is consistently documented systolic blood pressures near 314 with diastolic pressures over 90.   The patient does have family history of hypertension.   There is no prior documented abdominal bruit. The patient occasionally has flushing symptoms but denies palpitations. No episodes of syncope.There is no history of headache. There is no history of flash pulmonary edema.  The patient denies a history of renal disease.  The patient denies amaurosis fugax or recent TIA symptoms. There are no recent neurological changes noted. The patient denies claudication symptoms or rest pain symptoms. The patient denies history of DVT, PE or superficial thrombophlebitis. The patient denies recent  episodes of angina or shortness of breath.   There is a history of back problems and DJD of the lumbar and sacral spine but he has not had surgery.     No outpatient medications have been marked as taking for the 05/07/18 encounter (Office Visit) with Delana Meyer, Dolores Lory, MD.    Past Medical History:  Diagnosis Date  . Benign prostate hyperplasia   . Diabetes ([place])   . Elevated PSA   . GERD (gastroesophageal reflux disease)   . Hypercholesterolemia   . Impotence of organic origin   . Kidney stone   . PAD (peripheral artery disease) ([place])   . Peripheral artery disease ([place])    s/p stenting  . Skin cancer   . Stroke [facility])    2013 and 2017; 2019  . Urolithiasis     Past Surgical History:  Procedure Laterality Date  . BACK SURGERY  05/2016  . BIOPSY  12/28/2017   Procedure: BIOPSY;  Surgeon: Daneil Dolin, MD;  [facility];  Service: Endoscopy;;  gastric bx's  . COLONOSCOPY WITH PROPOFOL N/A 11/20/2017   Dr. Gala Romney: Diverticulosis,two 5 to 30 mm polyps in the cecum and rectum.  Cecal polypectomy site tattooed.  Pathology revealed tubular adenoma, no high-grade dysplasia.  Recommend colonoscopy in 6 months.  . ESOPHAGOGASTRODUODENOSCOPY (EGD) WITH PROPOFOL N/A 12/28/2017   Dr. Gala Romney: Normal esophagus, erosive gastropathy, duodenal erosions without bleeding.  Gastric biopsy benign.  No H. pylori on biopsy.  Marland Kitchen HERNIA REPAIR    . LOOP RECORDER INSERTION  06/27/2017  . PERCUTANEOUS STENT  INTERVENTION Left    Left leg for PAD  . POLYPECTOMY  11/20/2017   Procedure: POLYPECTOMY;  Surgeon: Daneil Dolin, MD;  Location[facility];  Service: Endoscopy;;  cecal  . SHOULDER SURGERY Right     Social History Social History   Tobacco Use  . Smoking status: Former Smoker    Packs/day: 0.25    Years: 48.00    Pack years: 12.00    Types: Cigarettes    Start date: 03/07/1969    Last attempt to quit: 04/03/2018    Years since quitting: 0.0  . Smokeless tobacco: Never Used    Substance Use Topics  . Alcohol use: No    Frequency: Never  . Drug use: No    Family History Family History  Problem Relation Age of Onset  . Heart attack Mother   . Diabetes Father   . Colon cancer Neg Hx   . Gastric cancer Neg Hx   . Esophageal cancer Neg Hx   No family history of bleeding/clotting disorders, porphyria or autoimmune disease   Allergies  Allergen Reactions  . Codeine Itching     REVIEW OF SYSTEMS (Negative unless checked)  Constitutional: [] Weight loss  [] Fever  [] Chills Cardiac: [] Chest pain   [] Chest pressure   [] Palpitations   [] Shortness of breath when laying flat   [x] Shortness of breath with exertion. Vascular:  [x] Pain in legs with walking   [x] Pain in legs at rest  [] History of DVT   [] Phlebitis   [] Swelling in legs   [] Varicose veins   [] Non-healing ulcers Pulmonary:   [] Uses home oxygen   [] Productive cough   [] Hemoptysis   [] Wheeze  [] COPD   [] Asthma Neurologic:  [] Dizziness   [] Seizures   [] History of stroke   [] History of TIA  [] Aphasia   [] Vissual changes   [] Weakness or numbness in arm   [] Weakness or numbness in leg Musculoskeletal:   [] Joint swelling   [] Joint pain   [] Low back pain Hematologic:  [] Easy bruising  [] Easy bleeding   [] Hypercoagulable state   [] Anemic Gastrointestinal:  [] Diarrhea   [] Vomiting  [] Gastroesophageal reflux/heartburn   [] Difficulty swallowing. Genitourinary:  [] Chronic kidney disease   [] Difficult urination  [] Frequent urination   [] Blood in urine Skin:  [] Rashes   [] Ulcers  Psychological:  [] History of anxiety   []  History of major depression.  Physical Examination  Vitals:   05/07/18 1307  BP: (!) 144/70  Pulse: 70  Resp: 12  Weight: 157 lb (71.2 kg)  Height: 5\' 9"  (1.753 m)   Body mass index is 23.18 kg/m. Gen: WD/WN, NAD Head: [place]/AT, No temporalis wasting.  Ear/Nose/Throat: Hearing grossly intact, nares w/o erythema or drainage, poor dentition Eyes: PER, EOMI, sclera nonicteric.  Neck: Supple, no  masses.  No bruit or JVD.  Pulmonary:  Good air movement, clear to auscultation bilaterally, no use of accessory muscles.  Cardiac: RRR, normal S1, S2, no Murmurs. Vascular:  Vessel Right Left  Radial Palpable Palpable  Brachial Palpable Palpable  Carotid Palpable Palpable  PT Not Palpable Not Palpable  DP Not Palpable Not Palpable  Gastrointestinal: soft, non-distended. No guarding/no peritoneal signs.  Musculoskeletal: M/S 5/5 throughout.  No deformity or atrophy.  Neurologic: CN 2-12 intact. Pain and light touch intact in extremities.  Symmetrical.  Speech is fluent. Motor exam as listed above. Psychiatric: Judgment intact, Mood & affect appropriate for pt's clinical situation. Dermatologic: No rashes or ulcers noted.  No changes consistent with cellulitis. Lymph : No Cervical lymphadenopathy, no  lichenification or skin changes of chronic lymphedema.  CBC Lab Results  Component Value Date   WBC 11.0 (H) 11/13/2017   HGB 13.3 11/13/2017   HCT 40.0 11/13/2017   MCV 92.6 11/13/2017   PLT 124 (L) 11/13/2017    BMET    Component Value Date/Time   NA 143 11/13/2017 1119   K 4.2 11/13/2017 1119   CL 108 11/13/2017 1119   CO2 29 11/13/2017 1119   GLUCOSE 178 (H) 11/13/2017 1119   BUN 23 11/13/2017 1119   CREATININE 1.11 01/12/2018 1012   CALCIUM 8.8 (L) 11/13/2017 1119   GFRNONAA >60 11/13/2017 1119   GFRAA >60 11/13/2017 1119   CrCl cannot be calculated (Patient's most recent lab result is older than the maximum 21 days allowed.).  COAG No results found for: INR, PROTIME  Radiology No results found.   Assessment/Plan 1. Right renal artery stenosis (HCC) Given patient's arterial disease optimal control of the patient's hypertension is important. BP is not acceptable today  Duplex ultrasound of the renal arteries will be obtained.  No invasive studies or intervention is indicated at this time.  The patient will continue the current antihypertensive medications, no  changes at this time.  The primary medical service will continue aggressive antihypertensive therapy as per the [facility] guidelines    A total of 70 minutes was spent with this patient and greater than 50% was spent in counseling and coordination of care with the patient.  Discussion included the treatment options for vascular disease including indications for surgery and intervention.  Also discussed is the appropriate timing of treatment.  In addition medical therapy was discussed.  - VAS US RENAL ARTERY DUPLEX; Future  2. Atherosclerosis of native artery of both lower extremities with intermittent claudication (HCC)  Recommend:  The patient has atypical pain symptoms for pure atherosclerotic disease. However, on physical exam there is evidence of mixed venous and arterial disease, given the diminished pulses of the legs.  Noninvasive studies including arterial ultrasound of the legs will be obtained and the patient will follow up with me to review these studies.  The patient should continue walking and begin a more formal exercise program. The patient should continue his antiplatelet therapy and aggressive treatment of the lipid abnormalities.    - VAS [place] ABI WITH/WO TBI; Future  3. Essential hypertension Continue antihypertensive medications as already ordered, these medications have been reviewed and there are no changes at this time.   4. Type 2 diabetes mellitus with diabetic peripheral angiopathy without gangrene, without long-term current use of insulin ([place]) Continue hypoglycemic medications as already ordered, these medications have been reviewed and there are no changes at this time.  Hgb A1C to be monitored as already arranged by primary service   5. Mixed hyperlipidemia Continue statin as ordered and reviewed, no changes at this time   Hortencia Pilar, MD  05/07/2018 1:11 PM

## 2018-05-08 ENCOUNTER — Encounter (INDEPENDENT_AMBULATORY_CARE_PROVIDER_SITE_OTHER): Payer: Self-pay | Admitting: Vascular Surgery

## 2018-05-12 ENCOUNTER — Encounter (INDEPENDENT_AMBULATORY_CARE_PROVIDER_SITE_OTHER): Payer: Self-pay | Admitting: Vascular Surgery

## 2018-05-16 ENCOUNTER — Ambulatory Visit (INDEPENDENT_AMBULATORY_CARE_PROVIDER_SITE_OTHER): Payer: Medicare Other

## 2018-05-16 ENCOUNTER — Encounter (INDEPENDENT_AMBULATORY_CARE_PROVIDER_SITE_OTHER): Payer: Self-pay | Admitting: Nurse Practitioner

## 2018-05-16 ENCOUNTER — Ambulatory Visit (INDEPENDENT_AMBULATORY_CARE_PROVIDER_SITE_OTHER): Payer: Medicare Other | Admitting: Nurse Practitioner

## 2018-05-16 ENCOUNTER — Other Ambulatory Visit: Payer: Self-pay

## 2018-05-16 VITALS — BP 126/74 | HR 61 | Resp 10 | Ht 69.0 in | Wt 154.0 lb

## 2018-05-16 DIAGNOSIS — I10 Essential (primary) hypertension: Secondary | ICD-10-CM | POA: Diagnosis not present

## 2018-05-16 DIAGNOSIS — E1151 Type 2 diabetes mellitus with diabetic peripheral angiopathy without gangrene: Secondary | ICD-10-CM

## 2018-05-16 DIAGNOSIS — Z79899 Other long term (current) drug therapy: Secondary | ICD-10-CM

## 2018-05-16 DIAGNOSIS — I701 Atherosclerosis of renal artery: Secondary | ICD-10-CM

## 2018-05-16 DIAGNOSIS — K219 Gastro-esophageal reflux disease without esophagitis: Secondary | ICD-10-CM | POA: Diagnosis not present

## 2018-05-16 DIAGNOSIS — I70213 Atherosclerosis of native arteries of extremities with intermittent claudication, bilateral legs: Secondary | ICD-10-CM

## 2018-05-16 DIAGNOSIS — Z7984 Long term (current) use of oral hypoglycemic drugs: Secondary | ICD-10-CM

## 2018-05-16 DIAGNOSIS — F17211 Nicotine dependence, cigarettes, in remission: Secondary | ICD-10-CM

## 2018-05-16 NOTE — Progress Notes (Signed)
SUBJECTIVE:  Patient ID: Jack Mcbride, male    DOB: 1950-08-13, 68 y.o.   MRN: [id number] Chief Complaint  Patient presents with   Follow-up    HPI  Jack Mcbride is a 68 y.o. male that sooner with complaints of pain and discomfort in his lower extremities as well as the incidental finding of possible renal artery stenosis on CT scan.  The patient states that he feels as if his legs are "waterlogged".  He states that the left lower extremity is worse.  Patient notes that the pain is variable and worse with activity.  It is consistent day-to-day.  He also notes that the pain occurs mainly in the morning he first gets up but overall it does not really occur with standing.  The patient is also had several strokes which have affected his mobility.  There is also the concern that his strokes have also affected his blood pressure control.  Today the patient's blood pressure is adequately controlled.  However they do note that it is variable at times despite blood pressure medication.  Denies any fever, chills, nausea, vomiting or diarrhea.  He does have a history of back problems and DJD of the lumbar and sacral spine but he has not had intervention.  According to his wife there was one point they were going to intervene however due to the nature of the surgery they felt that it was better to proceed surgical intervention at that time.  The right ABI is 1.06 the left is 0.96.  He has strong triphasic waveforms bilaterally in his tibial arteries.  He has strong toe waveforms bilaterally.  The kidneys are of normal size.  The velocities within the bilateral renal arteries are less than 150.  The highest velocity is at 123 within the region of the left renal artery. Past Medical History:  Diagnosis Date   Benign prostate hyperplasia    Diabetes ([place])    Elevated PSA    GERD (gastroesophageal reflux disease)    Hypercholesterolemia    Impotence of organic origin    Kidney stone     PAD (peripheral artery disease) ([place])    Peripheral artery disease ([place])    s/p stenting   Skin cancer    Stroke ([place])    2013 and 2017; 2019   Urolithiasis     Past Surgical History:  Procedure Laterality Date   BACK SURGERY  05/2016   BIOPSY  12/28/2017   Procedure: BIOPSY;  Surgeon: Daneil Dolin, MD;  [facility];  Service: Endoscopy;;  gastric bx's   COLONOSCOPY WITH PROPOFOL N/A 11/20/2017   Dr. Gala Romney: Diverticulosis,two 5 to 30 mm polyps in the cecum and rectum.  Cecal polypectomy site tattooed.  Pathology revealed tubular adenoma, no high-grade dysplasia.  Recommend colonoscopy in 6 months.   ESOPHAGOGASTRODUODENOSCOPY (EGD) WITH PROPOFOL N/A 12/28/2017   Dr. Gala Romney: Normal esophagus, erosive gastropathy, duodenal erosions without bleeding.  Gastric biopsy benign.  No H. pylori on biopsy.   HERNIA REPAIR     LOOP RECORDER INSERTION  06/27/2017   PERCUTANEOUS STENT INTERVENTION Left    Left leg for PAD   POLYPECTOMY  11/20/2017   Procedure: POLYPECTOMY;  Surgeon: Daneil Dolin, MD;  [facility];  Service: Endoscopy;;  cecal   SHOULDER SURGERY Right     Social History   Socioeconomic History   Marital status: Married    Spouse name: Not on file   Number of children: Not on file  Years of education: Not on file   Highest education level: Not on file  Occupational History   Not on file  Social Needs   Financial resource strain: Not on file   Food insecurity:    Worry: Not on file    Inability: Not on file   Transportation needs:    Medical: Not on file    Non-medical: Not on file  Tobacco Use   Smoking status: Former Smoker    Packs/day: 0.25    Years: 48.00    Pack years: 12.00    Types: Cigarettes    Start date: 03/07/1969    Last attempt to quit: 04/03/2018    Years since quitting: 0.1   Smokeless tobacco: Never Used  Substance and Sexual Activity   Alcohol use: No    Frequency: Never   Drug use: No    Sexual activity: Not on file  Lifestyle   Physical activity:    Days per week: Not on file    Minutes per session: Not on file   Stress: Not on file  Relationships   Social connections:    Talks on phone: Not on file    Gets together: Not on file    Attends religious service: Not on file    Active member of club or organization: Not on file    Attends meetings of clubs or organizations: Not on file    Relationship status: Not on file   Intimate partner violence:    Fear of current or ex partner: Not on file    Emotionally abused: Not on file    Physically abused: Not on file    Forced sexual activity: Not on file  Other Topics Concern   Not on file  Social History Narrative   Not on file    Family History  Problem Relation Age of Onset   Heart attack Mother    Diabetes Father    Colon cancer Neg Hx    Gastric cancer Neg Hx    Esophageal cancer Neg Hx     Allergies  Allergen Reactions   Codeine Itching     Review of Systems   Review of Systems: Negative Unless Checked Constitutional: _0 Weight loss  _1 Fever  _2 Chills Cardiac: _3 Chest pain   _4  Atrial Fibrillation  _5 Palpitations   _6 Shortness of breath when laying flat   _7 Shortness of breath with exertion. _8 Shortness of breath at rest Vascular:  _9 Pain in legs with walking   _10 Pain in legs with standing _11 Pain in legs when laying flat   _12 Claudication    _13 Pain in feet when laying flat    _14 History of DVT   _15 Phlebitis   _16 Swelling in legs   _17 Varicose veins   _18 Non-healing ulcers Pulmonary:   _19 Uses home oxygen   _20 Productive cough   _21 Hemoptysis   _22 Wheeze  _23 COPD   _24 Asthma Neurologic:  _25 Dizziness   _26 Seizures  _27 Blackouts _28 History of stroke   _29 History of TIA  _30 Aphasia   _31 Temporary Blindness   _32 Weakness or numbness in arm   _33 Weakness or numbness in leg Musculoskeletal:   _34 Joint swelling   _35 Joint pain   _36 Low back pain  _37  History of Knee Replacement _38 Arthritis _39 back Surgeries  _40  Spinal  Stenosis    Hematologic:  _41 Easy bruising  _42 Easy bleeding   _43 Hypercoagulable state   _44 Anemic Gastrointestinal:  _45 Diarrhea   _46 Vomiting  _47 Gastroesophageal reflux/heartburn   _48 Difficulty swallowing. _49 Abdominal pain Genitourinary:  _50 Chronic kidney disease   _51 Difficult urination  _52 Anuric   _53 Blood in  urine _0 Frequent urination  _1 Burning with urination   _2 Hematuria Skin:  _3 Rashes   _4 Ulcers _5 Wounds Psychological:  _6 History of anxiety   _7  History of major depression  _8  Memory Difficulties      OBJECTIVE:   Physical Exam  BP 126/74 (BP Location: Right Arm, Patient Position: Sitting, Cuff Size: Large)    Pulse 61    Resp 10    Ht _9  (1.753 m)    Wt 154 lb (69.9 kg)    BMI 22.74 kg/m   Gen: WD/WN, NAD Head: [place]/AT, No temporalis wasting.  Ear/Nose/Throat: Hearing grossly intact, nares w/o erythema or drainage Eyes: PER, EOMI, sclera nonicteric.  Neck: Supple, no masses.  No JVD.  Pulmonary:  Good air movement, no use of accessory muscles.  Cardiac: RRR Vascular:  Vessel Right Left  Radial Palpable Palpable  Dorsalis Pedis Not Palpable Not Palpable  Posterior Tibial Not Palpable Not Palpable   Gastrointestinal: soft, non-distended. No guarding/no peritoneal signs.  Musculoskeletal: Uses cane for ambulation. No deformity or atrophy.  Neurologic: Pain and light touch intact in extremities.  Symmetrical.    Speech somewhat garbled.  Motor exam as listed above. Psychiatric: Judgment intact, Mood & affect appropriate for pt's clinical situation. Dermatologic: No Venous rashes. No Ulcers Noted.  No changes consistent with cellulitis. Lymph : No Cervical lymphadenopathy, no lichenification or skin changes of chronic lymphedema.       ASSESSMENT AND PLAN:   1. Right renal artery stenosis (HCC) Recommend:  The patient has evidence of atherosclerotic changes of the renal artery.  However, ultrasound velocities do not suggest a greater than 50% stenosis of either renal  artery.  Due to fluctuations within his blood pressure control as well as slightly more elevated velocities in his left renal artery we will continue to monitor his renal artery stenosis.  Patient return in 1 year with a renal artery duplex.  The patient voices understanding of this plan and agrees.     2. Essential hypertension Continue antihypertensive medications as already ordered, these medications have been reviewed and there are no changes at this time.   3. Gastroesophageal reflux disease without esophagitis Continue PPI as already ordered, this medication has been reviewed and there are no changes at this time.  Avoidence of caffeine and alcohol  Moderate elevation of the head of the bed   4. Type 2 diabetes mellitus with diabetic peripheral angiopathy without gangrene, without long-term current use of insulin (HCC) Continue hypoglycemic medications as already ordered, these medications have been reviewed and there are no changes at this time.  Hgb A1C to be monitored as already arranged by primary service   5. Atherosclerosis of native artery of both lower extremities with intermittent claudication [facility]) Patient CT scan suggested possible high-grade stenosis of the patient's iliac artery however today the patient's ABIs and waveforms suggest no significant arterial disease.  Also the patient's description of pain and discomfort are not consistent with peripheral arterial disease.  The patient does have a significant history of degenerative disc disease in the lumbar spine.  Further work-up for the patient's lower extremity pain will be deferred to the primary care physician.  If further work-up is inconclusive we can repeat ABIs with toe lifts as well as possibly a lower extremity arterial duplex.  Current Outpatient Medications on File Prior to Visit  Medication Sig Dispense Refill   amLODipine-benazepril (LOTREL) 5-20 MG capsule Take 1 capsule by mouth daily.      aspirin 81 MG tablet Take  81 mg by mouth daily.      atenolol (TENORMIN) 25 MG tablet Take 25 mg by mouth daily.     atorvastatin (LIPITOR) 80 MG tablet Take 80 mg by mouth 3 (three) times a week. Taking 3 times weekly     cyclobenzaprine (FLEXERIL) 10 MG tablet Take 10 mg by mouth at bedtime.  2   dicyclomine (BENTYL) 20 MG tablet Take 20 mg by mouth 2 (two) times daily.     finasteride (PROSCAR) 5 MG tablet Take 5 mg by mouth at bedtime.  3   furosemide (LASIX) 20 MG tablet Take 20 mg by mouth daily as needed for edema. Pt can take 1 tab for 3 days as needed for ankle swelling      glimepiride (AMARYL) 2 MG tablet Take 2 mg by mouth 2 (two) times daily.     LEVEMIR FLEXTOUCH 100 UNIT/ML Pen Inject 27 Units into the skin at bedtime.   1   lubiprostone (AMITIZA) 24 MCG capsule Take 1 capsule (24 mcg total) by mouth 2 (two) times daily with a meal. 60 capsule 3   metFORMIN (GLUCOPHAGE) 1000 MG tablet Take 500 mg by mouth 2 (two) times daily with a meal. Extended release  2   Na Sulfate-K Sulfate-Mg Sulf 17.5-3.13-1.6 GM/177ML SOLN Take 1 kit by mouth as directed. 1 Bottle 0   niacin 500 MG tablet Take 500 mg by mouth daily.   1   pantoprazole (PROTONIX) 40 MG tablet TAKE 1 TABLET BY MOUTH 2 TIMES DAILY BEFORE A MEAL. 180 tablet 0   tamsulosin (FLOMAX) 0.4 MG CAPS capsule Take 0.4 mg by mouth at bedtime.  3   traZODone (DESYREL) 50 MG tablet Take 50 mg by mouth at bedtime.   1   tadalafil (CIALIS) 20 MG tablet Take 20 mg by mouth daily as needed for erectile dysfunction.      No current facility-administered medications on file prior to visit.     There are no Patient Instructions on file for this visit. No follow-ups on file.   Kris Hartmann, NP  This note was completed with Sales executive.  Any errors are purely unintentional.

## 2018-06-01 ENCOUNTER — Telehealth: Payer: Self-pay | Admitting: Internal Medicine

## 2018-06-01 NOTE — Telephone Encounter (Signed)
Needs to hold on colonoscopy given recent acute changes in medical history, recurrent CVA/TIAs.   I would suggest OV in 8 weeks. Can we hold a date for colonoscopy for about two weeks after his OV?

## 2018-06-01 NOTE — Telephone Encounter (Signed)
[phone] please call patient about rescheduling his tcs.

## 2018-06-01 NOTE — Telephone Encounter (Signed)
Spoke with patient spouse. Patient had a stroke 1/28 and was in [facility] for 2 1/2 weeks. Patient has been very weak. On Wednesday patient lost control of legs and went to [place]. He was diagnosed with TIA on 3/25. Please advise regarding procedure scheduled for 4/9. Thanks

## 2018-06-04 NOTE — Telephone Encounter (Signed)
D/t pt schedule he is coming in on 6/19 at 10:30am. Holding spot 6/29 at 10am.

## 2018-06-04 NOTE — Telephone Encounter (Signed)
Called endo and LMOVM to cancel 

## 2018-06-07 ENCOUNTER — Inpatient Hospital Stay (HOSPITAL_COMMUNITY): Admission: RE | Admit: 2018-06-07 | Payer: Medicare Other | Source: Ambulatory Visit

## 2018-06-11 ENCOUNTER — Other Ambulatory Visit: Payer: Self-pay | Admitting: Gastroenterology

## 2018-06-11 DIAGNOSIS — K5900 Constipation, unspecified: Secondary | ICD-10-CM

## 2018-06-14 ENCOUNTER — Ambulatory Visit (HOSPITAL_COMMUNITY): Admission: RE | Admit: 2018-06-14 | Payer: Medicare Other | Source: Home / Self Care | Admitting: Internal Medicine

## 2018-06-14 ENCOUNTER — Encounter (HOSPITAL_COMMUNITY): Admission: RE | Payer: Self-pay | Source: Home / Self Care

## 2018-06-14 SURGERY — COLONOSCOPY WITH PROPOFOL
Anesthesia: Monitor Anesthesia Care

## 2018-07-19 ENCOUNTER — Other Ambulatory Visit: Payer: Self-pay | Admitting: Nurse Practitioner

## 2018-08-24 ENCOUNTER — Other Ambulatory Visit: Payer: Self-pay

## 2018-08-24 ENCOUNTER — Encounter: Payer: Self-pay | Admitting: Gastroenterology

## 2018-08-24 ENCOUNTER — Ambulatory Visit (INDEPENDENT_AMBULATORY_CARE_PROVIDER_SITE_OTHER): Payer: Medicare Other | Admitting: Gastroenterology

## 2018-08-24 VITALS — BP 107/65 | HR 77 | Temp 97.2°F | Ht 69.0 in | Wt 155.6 lb

## 2018-08-24 DIAGNOSIS — I70213 Atherosclerosis of native arteries of extremities with intermittent claudication, bilateral legs: Secondary | ICD-10-CM

## 2018-08-24 DIAGNOSIS — D126 Benign neoplasm of colon, unspecified: Secondary | ICD-10-CM | POA: Diagnosis not present

## 2018-08-24 NOTE — H&P (View-Only) (Signed)
Primary Care Physician:  Sherrilee Gilles, DO  Primary Gastroenterologist:  Garfield Cornea, MD   Chief Complaint  Patient presents with  . Colonoscopy    HPI:  Jack Mcbride is a 68 y.o. male here to reschedule colonoscopy that was missed earlier this year due to stroke.  Patient was seen back in January.  He had a colonoscopy in September 2019 to evaluate elevated CEA.  He had 2 polyps, measuring 5 to 30 mm, tubular adenomas.  One removed piecemeal from the cecum, site tattooed.  Plan was for 30-month surveillance colonoscopy.  When I saw him back in January he complained of 6 months of intermittent vomiting associated with nausea occurring at least weekly.  He reported a 40 pound weight loss over couple of years having to replace his entire wardrobe.  He had a EGD in October 2019 that showed erosive gastropathy, duodenal erosions without bleeding, gastric biopsy benign, no H. Pylori.  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  No evidence of chronic mesenteric ischemia.  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  On the left, this results in a focal moderate high-grade stenosis.  Midline ventral abdominal hernia containing a single loop of small bowel without evidence of wall thickening, obstruction or inflammation.  Prostatomegaly with irregular central enhancement.  He has seen vein and vascular and is being followed for the above findings.  Since we last saw him he was hospitalized in [place] due to stroke.  He had a stroke on January 28 and was in the hospital for a couple of weeks.  He had a TIA in March.  Previously on Plavix but now on Eliquis.  He has had his initial evaluation by hematologist in [place], Dr. Tacey Heap who is doing a coagulopathy work-up.  His cardiologist, Dr. Quincy Simmonds, at [facility] [phone]) is managing his Eliquis.   Patient sees speech therapy since his stroke.  Doing exercises at home.  Denies any change in his diet, still on thin liquids.  Patient states he is doing better.  His reflux is completely controlled.  No heartburn, vomiting, abdominal pain.  Appetite has been better the last 2 months.  His weight is stable since March, down about 3 pounds since January.  Bowel movements regular.  No blood in the stool or melena.  Current Outpatient Medications  Medication Sig Dispense Refill  . AMITIZA 8 MCG capsule TAKE 1 CAPSULE BY MOUTH TWICE A DAY 180 capsule 1  . amLODipine-benazepril (LOTREL) 5-20 MG capsule Take 1 capsule by mouth daily.    Marland Kitchen apixaban (ELIQUIS) 5 MG TABS tablet Take 5 mg by mouth 2 (two) times daily.    Marland Kitchen aspirin 81 MG tablet Take 81 mg by mouth daily.     Marland Kitchen atorvastatin (LIPITOR) 80 MG tablet Take 80 mg by mouth 3 (three) times a week. Taking 3 times weekly    . finasteride (PROSCAR) 5 MG tablet Take 5 mg by mouth at bedtime.  3  . furosemide (LASIX) 20 MG tablet Take 20 mg by mouth daily as needed for edema. Pt can take 1 tab for 3 days as needed for ankle swelling     . glimepiride (AMARYL) 2 MG tablet Take 2 mg by mouth 2 (two) times daily.    Marland Kitchen LEVEMIR FLEXTOUCH 100 UNIT/ML Pen Inject 27 Units into the skin at bedtime.   1  . metFORMIN (GLUCOPHAGE-XR) 500  MG 24 hr tablet Take 500 mg by mouth daily with breakfast.    . metoprolol tartrate (LOPRESSOR) 25 MG tablet Take 25 mg by mouth daily.    . Multiple Vitamins-Minerals (AIRBORNE) CHEW Chew 3 each by mouth daily.    . niacin 500 MG tablet Take 500 mg by mouth daily.   1  . pantoprazole (PROTONIX) 40 MG tablet TAKE 1 TABLET BY MOUTH 2 TIMES DAILY BEFORE A MEAL. 180 tablet 1  . sertraline (ZOLOFT) 50 MG tablet Take 50 mg by mouth daily.    . tamsulosin (FLOMAX) 0.4 MG CAPS capsule Take 0.4 mg by mouth at bedtime.  3  . traZODone (DESYREL) 50 MG tablet Take 50 mg by mouth at bedtime.   1  . vitamin B-12 (CYANOCOBALAMIN) 1000 MCG  tablet Take 1,000 mcg by mouth daily.     No current facility-administered medications for this visit.     Allergies as of 08/24/2018 - Review Complete 08/24/2018  Allergen Reaction Noted  . Codeine Itching 09/05/2011    Past Medical History:  Diagnosis Date  . Benign prostate hyperplasia   . Diabetes ([place])   . Elevated PSA   . GERD (gastroesophageal reflux disease)   . Hypercholesterolemia   . Impotence of organic origin   . Kidney stone   . PAD (peripheral artery disease) ([place])   . Peripheral artery disease ([place])    s/p stenting  . Skin cancer   . Stroke (cerebrum) ([place])   . Stroke [facility])    2013 and 2017; 2019  . Urolithiasis     Past Surgical History:  Procedure Laterality Date  . BACK SURGERY  05/2016  . BIOPSY  12/28/2017   Procedure: BIOPSY;  Surgeon: Daneil Dolin, MD;  [facility];  Service: Endoscopy;;  gastric bx's  . COLONOSCOPY WITH PROPOFOL N/A 11/20/2017   Dr. Gala Romney: Diverticulosis,two 5 to 30 mm polyps in the cecum and rectum.  Cecal polypectomy site tattooed.  Pathology revealed tubular adenoma, no high-grade dysplasia.  Recommend colonoscopy in 6 months.  . ESOPHAGOGASTRODUODENOSCOPY (EGD) WITH PROPOFOL N/A 12/28/2017   Dr. Gala Romney: Normal esophagus, erosive gastropathy, duodenal erosions without bleeding.  Gastric biopsy benign.  No H. pylori on biopsy.  Marland Kitchen HERNIA REPAIR    . LOOP RECORDER INSERTION  06/27/2017  . PERCUTANEOUS STENT INTERVENTION Left    Left leg for PAD  . POLYPECTOMY  11/20/2017   Procedure: POLYPECTOMY;  Surgeon: Daneil Dolin, MD;  Location[facility];  Service: Endoscopy;;  cecal  . SHOULDER SURGERY Right     Family History  Problem Relation Age of Onset  . Heart attack Mother   . Diabetes Father   . Colon cancer Neg Hx   . Gastric cancer Neg Hx   . Esophageal cancer Neg Hx     Social History   Socioeconomic History  . Marital status: Married    Spouse name: Not on file  . Number of children: Not on  file  . Years of education: Not on file  . Highest education level: Not on file  Occupational History  . Not on file  Social Needs  . Financial resource strain: Not on file  . Food insecurity    Worry: Not on file    Inability: Not on file  . Transportation needs    Medical: Not on file    Non-medical: Not on file  Tobacco Use  . Smoking status: Former Smoker    Packs/day: 0.25  Years: 48.00    Pack years: 12.00    Types: Cigarettes    Start date: 03/07/1969    Quit date: 04/03/2018    Years since quitting: 0.3  . Smokeless tobacco: Never Used  Substance and Sexual Activity  . Alcohol use: No    Frequency: Never  . Drug use: No  . Sexual activity: Not on file  Lifestyle  . Physical activity    Days per week: Not on file    Minutes per session: Not on file  . Stress: Not on file  Relationships  . Social Herbalist on phone: Not on file    Gets together: Not on file    Attends religious service: Not on file    Active member of club or organization: Not on file    Attends meetings of clubs or organizations: Not on file    Relationship status: Not on file  . Intimate partner violence    Fear of current or ex partner: Not on file    Emotionally abused: Not on file    Physically abused: Not on file    Forced sexual activity: Not on file  Other Topics Concern  . Not on file  Social History Narrative  . Not on file      ROS:  General: Negative for anorexia, weight loss, fever, chills, fatigue, positive weakness.  See HPI.  Weight loss has stabilized. Eyes: Negative for vision changes.  ENT: Negative for hoarseness, difficulty swallowing , nasal congestion. CV: Negative for chest pain, angina, palpitations, dyspnea on exertion, peripheral edema.  Respiratory: Negative for dyspnea at rest, dyspnea on exertion, cough, sputum, wheezing.  GI: See history of present illness. GU:  Negative for dysuria, hematuria, urinary incontinence, urinary frequency,  nocturnal urination.  MS: Negative for joint pain, low back pain.  Derm: Negative for rash or itching.  Neuro: Negative for weakness, abnormal sensation, seizure, frequent headaches, memory loss, confusion.  Equilibrium issues since stroke Psych: Negative for anxiety, depression, suicidal ideation, hallucinations.  Endo: Negative for unusual weight change.  See HPI Heme: Negative for bruising or bleeding. Allergy: Negative for rash or hives.    Physical Examination:  BP 107/65   Pulse 77   Temp (!) 97.2 F (36.2 C)   Ht 5\' 9"  (1.753 m)   Wt 155 lb 9.6 oz (70.6 kg)   BMI 22.98 kg/m    General: Well-nourished, well-developed in no acute distress.  Accompanied by wife Head: Normocephalic, atraumatic.   Eyes: Conjunctiva pink, no icterus. Mouth: Oropharyngeal mucosa moist and pink , no lesions erythema or exudate. Neck: Supple without thyromegaly, masses, or lymphadenopathy.  Lungs: Clear to auscultation bilaterally.  Heart: Regular rate and rhythm, no murmurs rubs or gallops.  Abdomen: Bowel sounds are normal, nontender, nondistended, no hepatosplenomegaly or masses, no abdominal bruits or    hernia , no rebound or guarding.   Rectal: Not performed Extremities: No lower extremity edema. No clubbing or deformities.  Neuro: Alert and oriented x 4 , grossly normal neurologically.  Skin: Warm and dry, no rash or jaundice.   Psych: Alert and cooperative, normal mood and affect.    Imaging Studies: No results found.  Impression/plan:  68 year old gentleman presenting to reschedule colonoscopy.  He was seen back in January, scheduled for a 64-month surveillance colonoscopy for a large cecal tubular adenoma which was removed piecemeal during colonoscopy back September 2019.  Patient was scheduled in April but colonoscopy was canceled because he had had change in  medical condition.  He was in the hospital back in January with stroke, had an additional TIA in March.  He was switched from  Plavix to Eliquis.   When we saw him last year he was also being worked up for 40 pound weight loss going on for a couple years.  He had EGD, colonoscopy, CTA abdomen pelvis as previously outlined.  Mesenteric vascular look good but he did have some moderate right renal artery stenosis and bilateral common iliac artery abnormality/penetrating ulcerations with dissections and is since been referred to vascular for management.  Fortunately his reflux is much better controlled.  His appetite has improved over the past couple of months.  His weight has stabilized.  Plan for colonoscopy in the near future. Plan for deep sedation as before.  I have discussed the risks, alternatives, benefits with regards to but not limited to the risk of reaction to medication, bleeding, infection, perforation and the patient is agreeable to proceed. Written consent to be obtained.  HE IS ON ELIQUIS. I WILL DISCUSS WITH DR. Gala Romney, ?PERFORM COLONOSCOPY IN ELIQUIS? GIVEN STROKE CONCERNS. PATIENT IS AWARE THAT WE WILL PROVIDE WITH ADDITIONAL INPUT REGARDING ELIQUIS PRIOR TO HIS SCHEDULED PROCEDURE ON 09/03/18.  HE ALSO HAS BEEN SEEING SPEECH THERAPIST SINCE CVA BUT HAS HAD NO MODIFICATIONS IN HIS DIET. HE WILL HAVE SUPREP AND ENCOURAGED HIM TAKING HIS TIME CONSUMING PREP.

## 2018-08-24 NOTE — Progress Notes (Signed)
Primary Care Physician:  Sherrilee Gilles, DO  Primary Gastroenterologist:  Garfield Cornea, MD   Chief Complaint  Patient presents with  . Colonoscopy    HPI:  Jack Mcbride is a 68 y.o. male here to reschedule colonoscopy that was missed earlier this year due to stroke.  Patient was seen back in January.  He had a colonoscopy in September 2019 to evaluate elevated CEA.  He had 2 polyps, measuring 5 to 30 mm, tubular adenomas.  One removed piecemeal from the cecum, site tattooed.  Plan was for 67-month surveillance colonoscopy.  When I saw him back in January he complained of 6 months of intermittent vomiting associated with nausea occurring at least weekly.  He reported a 40 pound weight loss over couple of years having to replace his entire wardrobe.  He had a EGD in October 2019 that showed erosive gastropathy, duodenal erosions without bleeding, gastric biopsy benign, no H. Pylori.  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  No evidence of chronic mesenteric ischemia.  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  On the left, this results in a focal moderate high-grade stenosis.  Midline ventral abdominal hernia containing a single loop of small bowel without evidence of wall thickening, obstruction or inflammation.  Prostatomegaly with irregular central enhancement.  He has seen vein and vascular and is being followed for the above findings.  Since we last saw him he was hospitalized in [place] due to stroke.  He had a stroke on January 28 and was in the hospital for a couple of weeks.  He had a TIA in March.  Previously on Plavix but now on Eliquis.  He has had his initial evaluation by hematologist in [place], Dr. Tacey Heap who is doing a coagulopathy work-up.  His cardiologist, Dr. Quincy Simmonds, at [facility] [phone]) is managing his Eliquis.   Patient sees speech therapy since his stroke.  Doing exercises at home.  Denies any change in his diet, still on thin liquids.  Patient states he is doing better.  His reflux is completely controlled.  No heartburn, vomiting, abdominal pain.  Appetite has been better the last 2 months.  His weight is stable since March, down about 3 pounds since January.  Bowel movements regular.  No blood in the stool or melena.  Current Outpatient Medications  Medication Sig Dispense Refill  . AMITIZA 8 MCG capsule TAKE 1 CAPSULE BY MOUTH TWICE A DAY 180 capsule 1  . amLODipine-benazepril (LOTREL) 5-20 MG capsule Take 1 capsule by mouth daily.    Marland Kitchen apixaban (ELIQUIS) 5 MG TABS tablet Take 5 mg by mouth 2 (two) times daily.    Marland Kitchen aspirin 81 MG tablet Take 81 mg by mouth daily.     Marland Kitchen atorvastatin (LIPITOR) 80 MG tablet Take 80 mg by mouth 3 (three) times a week. Taking 3 times weekly    . finasteride (PROSCAR) 5 MG tablet Take 5 mg by mouth at bedtime.  3  . furosemide (LASIX) 20 MG tablet Take 20 mg by mouth daily as needed for edema. Pt can take 1 tab for 3 days as needed for ankle swelling     . glimepiride (AMARYL) 2 MG tablet Take 2 mg by mouth 2 (two) times daily.    Marland Kitchen LEVEMIR FLEXTOUCH 100 UNIT/ML Pen Inject 27 Units into the skin at bedtime.   1  . metFORMIN (GLUCOPHAGE-XR) 500  MG 24 hr tablet Take 500 mg by mouth daily with breakfast.    . metoprolol tartrate (LOPRESSOR) 25 MG tablet Take 25 mg by mouth daily.    . Multiple Vitamins-Minerals (AIRBORNE) CHEW Chew 3 each by mouth daily.    . niacin 500 MG tablet Take 500 mg by mouth daily.   1  . pantoprazole (PROTONIX) 40 MG tablet TAKE 1 TABLET BY MOUTH 2 TIMES DAILY BEFORE A MEAL. 180 tablet 1  . sertraline (ZOLOFT) 50 MG tablet Take 50 mg by mouth daily.    . tamsulosin (FLOMAX) 0.4 MG CAPS capsule Take 0.4 mg by mouth at bedtime.  3  . traZODone (DESYREL) 50 MG tablet Take 50 mg by mouth at bedtime.   1  . vitamin B-12 (CYANOCOBALAMIN) 1000 MCG  tablet Take 1,000 mcg by mouth daily.     No current facility-administered medications for this visit.     Allergies as of 08/24/2018 - Review Complete 08/24/2018  Allergen Reaction Noted  . Codeine Itching 09/05/2011    Past Medical History:  Diagnosis Date  . Benign prostate hyperplasia   . Diabetes ([place])   . Elevated PSA   . GERD (gastroesophageal reflux disease)   . Hypercholesterolemia   . Impotence of organic origin   . Kidney stone   . PAD (peripheral artery disease) ([place])   . Peripheral artery disease ([place])    s/p stenting  . Skin cancer   . Stroke (cerebrum) ([place])   . Stroke [facility])    2013 and 2017; 2019  . Urolithiasis     Past Surgical History:  Procedure Laterality Date  . BACK SURGERY  05/2016  . BIOPSY  12/28/2017   Procedure: BIOPSY;  Surgeon: Daneil Dolin, MD;  [facility];  Service: Endoscopy;;  gastric bx's  . COLONOSCOPY WITH PROPOFOL N/A 11/20/2017   Dr. Gala Romney: Diverticulosis,two 5 to 30 mm polyps in the cecum and rectum.  Cecal polypectomy site tattooed.  Pathology revealed tubular adenoma, no high-grade dysplasia.  Recommend colonoscopy in 6 months.  . ESOPHAGOGASTRODUODENOSCOPY (EGD) WITH PROPOFOL N/A 12/28/2017   Dr. Gala Romney: Normal esophagus, erosive gastropathy, duodenal erosions without bleeding.  Gastric biopsy benign.  No H. pylori on biopsy.  Marland Kitchen HERNIA REPAIR    . LOOP RECORDER INSERTION  06/27/2017  . PERCUTANEOUS STENT INTERVENTION Left    Left leg for PAD  . POLYPECTOMY  11/20/2017   Procedure: POLYPECTOMY;  Surgeon: Daneil Dolin, MD;  [facility];  Service: Endoscopy;;  cecal  . SHOULDER SURGERY Right     Family History  Problem Relation Age of Onset  . Heart attack Mother   . Diabetes Father   . Colon cancer Neg Hx   . Gastric cancer Neg Hx   . Esophageal cancer Neg Hx     Social History   Socioeconomic History  . Marital status: Married    Spouse name: Not on file  . Number of children: Not on  file  . Years of education: Not on file  . Highest education level: Not on file  Occupational History  . Not on file  Social Needs  . Financial resource strain: Not on file  . Food insecurity    Worry: Not on file    Inability: Not on file  . Transportation needs    Medical: Not on file    Non-medical: Not on file  Tobacco Use  . Smoking status: Former Smoker    Packs/day: 0.25  Years: 48.00    Pack years: 12.00    Types: Cigarettes    Start date: 03/07/1969    Quit date: 04/03/2018    Years since quitting: 0.3  . Smokeless tobacco: Never Used  Substance and Sexual Activity  . Alcohol use: No    Frequency: Never  . Drug use: No  . Sexual activity: Not on file  Lifestyle  . Physical activity    Days per week: Not on file    Minutes per session: Not on file  . Stress: Not on file  Relationships  . Social Herbalist on phone: Not on file    Gets together: Not on file    Attends religious service: Not on file    Active member of club or organization: Not on file    Attends meetings of clubs or organizations: Not on file    Relationship status: Not on file  . Intimate partner violence    Fear of current or ex partner: Not on file    Emotionally abused: Not on file    Physically abused: Not on file    Forced sexual activity: Not on file  Other Topics Concern  . Not on file  Social History Narrative  . Not on file      ROS:  General: Negative for anorexia, weight loss, fever, chills, fatigue, positive weakness.  See HPI.  Weight loss has stabilized. Eyes: Negative for vision changes.  ENT: Negative for hoarseness, difficulty swallowing , nasal congestion. CV: Negative for chest pain, angina, palpitations, dyspnea on exertion, peripheral edema.  Respiratory: Negative for dyspnea at rest, dyspnea on exertion, cough, sputum, wheezing.  GI: See history of present illness. GU:  Negative for dysuria, hematuria, urinary incontinence, urinary frequency,  nocturnal urination.  MS: Negative for joint pain, low back pain.  Derm: Negative for rash or itching.  Neuro: Negative for weakness, abnormal sensation, seizure, frequent headaches, memory loss, confusion.  Equilibrium issues since stroke Psych: Negative for anxiety, depression, suicidal ideation, hallucinations.  Endo: Negative for unusual weight change.  See HPI Heme: Negative for bruising or bleeding. Allergy: Negative for rash or hives.    Physical Examination:  BP 107/65   Pulse 77   Temp (!) 97.2 F (36.2 C)   Ht 5\' 9"  (1.753 m)   Wt 155 lb 9.6 oz (70.6 kg)   BMI 22.98 kg/m    General: Well-nourished, well-developed in no acute distress.  Accompanied by wife Head: Normocephalic, atraumatic.   Eyes: Conjunctiva pink, no icterus. Mouth: Oropharyngeal mucosa moist and pink , no lesions erythema or exudate. Neck: Supple without thyromegaly, masses, or lymphadenopathy.  Lungs: Clear to auscultation bilaterally.  Heart: Regular rate and rhythm, no murmurs rubs or gallops.  Abdomen: Bowel sounds are normal, nontender, nondistended, no hepatosplenomegaly or masses, no abdominal bruits or    hernia , no rebound or guarding.   Rectal: Not performed Extremities: No lower extremity edema. No clubbing or deformities.  Neuro: Alert and oriented x 4 , grossly normal neurologically.  Skin: Warm and dry, no rash or jaundice.   Psych: Alert and cooperative, normal mood and affect.    Imaging Studies: No results found.  Impression/plan:  68 year old gentleman presenting to reschedule colonoscopy.  He was seen back in January, scheduled for a 18-month surveillance colonoscopy for a large cecal tubular adenoma which was removed piecemeal during colonoscopy back September 2019.  Patient was scheduled in April but colonoscopy was canceled because he had had change in  medical condition.  He was in the hospital back in January with stroke, had an additional TIA in March.  He was switched from  Plavix to Eliquis.   When we saw him last year he was also being worked up for 40 pound weight loss going on for a couple years.  He had EGD, colonoscopy, CTA abdomen pelvis as previously outlined.  Mesenteric vascular look good but he did have some moderate right renal artery stenosis and bilateral common iliac artery abnormality/penetrating ulcerations with dissections and is since been referred to vascular for management.  Fortunately his reflux is much better controlled.  His appetite has improved over the past couple of months.  His weight has stabilized.  Plan for colonoscopy in the near future. Plan for deep sedation as before.  I have discussed the risks, alternatives, benefits with regards to but not limited to the risk of reaction to medication, bleeding, infection, perforation and the patient is agreeable to proceed. Written consent to be obtained.  HE IS ON ELIQUIS. I WILL DISCUSS WITH DR. Gala Romney, ?PERFORM COLONOSCOPY IN ELIQUIS? GIVEN STROKE CONCERNS. PATIENT IS AWARE THAT WE WILL PROVIDE WITH ADDITIONAL INPUT REGARDING ELIQUIS PRIOR TO HIS SCHEDULED PROCEDURE ON 09/03/18.  HE ALSO HAS BEEN SEEING SPEECH THERAPIST SINCE CVA BUT HAS HAD NO MODIFICATIONS IN HIS DIET. HE WILL HAVE SUPREP AND ENCOURAGED HIM TAKING HIS TIME CONSUMING PREP.

## 2018-08-24 NOTE — Patient Instructions (Signed)
Colonoscopy as scheduled. See separate instructions.   WE WILL PROVIDE YOU WITH ADDITIONAL INSTRUCTIONS FOR ELIQUIS ONCE I HAVE DISCUSSED WITH DR. Gala Romney.

## 2018-08-26 ENCOUNTER — Telehealth: Payer: Self-pay | Admitting: Gastroenterology

## 2018-08-26 NOTE — Telephone Encounter (Signed)
Please let patient know the following. I have discussed Eliquis with RMR.  1. Stay ON Eliquis for his colonoscopy.  2. When he is drinking his prep, tell him to make sure he takes time swallowing/do not swallow fast, and follow speech therapist instructions if any provided on swallowing since his cva.

## 2018-08-27 NOTE — Telephone Encounter (Signed)
Called and informed pt's wife.  Dr. Gala Romney, he is scheduled for TCS 09/03/18. His hematologist scheduled CT scan with contrast 08/31/18. His wife wants to know if it is ok for him to drink contrast and have CT 08/31/18 or should it be rescheduled to after TCS?

## 2018-08-27 NOTE — Telephone Encounter (Signed)
No need to change scheduling

## 2018-08-27 NOTE — Telephone Encounter (Signed)
Called and informed pt's wife. She will reschedule CT d/t he will have COVID test 08/30/18 and will need to quarantine until procedure.

## 2018-08-27 NOTE — Patient Instructions (Signed)
Jack Mcbride  08/27/2018     @PREFPERIOPPHARMACY @   Your procedure is scheduled on  08/31/2018   Report to [facility] at  830  A.M.  Call this number if you have problems the morning of surgery:  [phone]   Remember:  Follow the diet and prep instructions given to you by Dr Roseanne Kaufman office.                    Take these medicines the morning of surgery with A SIP OF WATER  Amlodipine, proscar, metoprolol, protonix, zoloft. Take 1/2 of your usual insulin the night before your procedure. DO NOT take any medications for diabetes the morning of your procedure.    Do not wear jewelry, make-up or nail polish.  Do not wear lotions, powders, or perfumes, or deodorant.  Do not shave 48 hours prior to surgery.  Men may shave face and neck.  Do not bring valuables to the hospital.  [facility] is not responsible for any belongings or valuables.  Contacts, dentures or bridgework may not be worn into surgery.  Leave your suitcase in the car.  After surgery it may be brought to your room.  For patients admitted to the hospital, discharge time will be determined by your treatment team.  Patients discharged the day of surgery will not be allowed to drive home.   Name and phone number of your driver:   family Special instructions:  None  Please read over the following fact sheets that you were given. Anesthesia Post-op Instructions and Care and Recovery After Surgery       Colonoscopy, Adult, Care After This sheet gives you information about how to care for yourself after your procedure. Your health care provider may also give you more specific instructions. If you have problems or questions, contact your health care provider. What can I expect after the procedure? After the procedure, it is common to have:  A small amount of blood in your stool for 24 hours after the procedure.  Some gas.  Mild abdominal cramping or bloating. Follow these instructions at home: General  instructions  For the first 24 hours after the procedure: ? Do not drive or use machinery. ? Do not sign important documents. ? Do not drink alcohol. ? Do your regular daily activities at a slower pace than normal. ? Eat soft, easy-to-digest foods.  Take over-the-counter or prescription medicines only as told by your health care provider. Relieving cramping and bloating   Try walking around when you have cramps or feel bloated.  Apply heat to your abdomen as told by your health care provider. Use a heat source that your health care provider recommends, such as a moist heat pack or a heating pad. ? Place a towel between your skin and the heat source. ? Leave the heat on for 20-30 minutes. ? Remove the heat if your skin turns bright red. This is especially important if you are unable to feel pain, heat, or cold. You may have a greater risk of getting burned. Eating and drinking   Drink enough fluid to keep your urine pale yellow.  Resume your normal diet as instructed by your health care provider. Avoid heavy or fried foods that are hard to digest.  Avoid drinking alcohol for as long as instructed by your health care provider. Contact a health care provider if:  You have blood in your stool 2-3 days after the procedure. Get help  right away if:  You have more than a small spotting of blood in your stool.  You pass large blood clots in your stool.  Your abdomen is swollen.  You have nausea or vomiting.  You have a fever.  You have increasing abdominal pain that is not relieved with medicine. Summary  After the procedure, it is common to have a small amount of blood in your stool. You may also have mild abdominal cramping and bloating.  For the first 24 hours after the procedure, do not drive or use machinery, sign important documents, or drink alcohol.  Contact your health care provider if you have a lot of blood in your stool, nausea or vomiting, a fever, or increased  abdominal pain. This information is not intended to replace advice given to you by your health care provider. Make sure you discuss any questions you have with your health care provider. Document Released: 10/06/2003 Document Revised: 12/14/2016 Document Reviewed: 05/05/2015 Elsevier Interactive Patient Education  2019 [place], Care After These instructions provide you with information about caring for yourself after your procedure. Your health care provider may also give you more specific instructions. Your treatment has been planned according to current medical practices, but problems sometimes occur. Call your health care provider if you have any problems or questions after your procedure. What can I expect after the procedure? After your procedure, you may:  Feel sleepy for several hours.  Feel clumsy and have poor balance for several hours.  Feel forgetful about what happened after the procedure.  Have poor judgment for several hours.  Feel nauseous or vomit.  Have a sore throat if you had a breathing tube during the procedure. Follow these instructions at home: For at least 24 hours after the procedure:      Have a responsible adult stay with you. It is important to have someone help care for you until you are awake and alert.  Rest as needed.  Do not: ? Participate in activities in which you could fall or become injured. ? Drive. ? Use heavy machinery. ? Drink alcohol. ? Take sleeping pills or medicines that cause drowsiness. ? Make important decisions or sign legal documents. ? Take care of children on your own. Eating and drinking  Follow the diet that is recommended by your health care provider.  If you vomit, drink water, juice, or soup when you can drink without vomiting.  Make sure you have little or no nausea before eating solid foods. General instructions  Take over-the-counter and prescription medicines only as told by  your health care provider.  If you have sleep apnea, surgery and certain medicines can increase your risk for breathing problems. Follow instructions from your health care provider about wearing your sleep device: ? Anytime you are sleeping, including during daytime naps. ? While taking prescription pain medicines, sleeping medicines, or medicines that make you drowsy.  If you smoke, do not smoke without supervision.  Keep all follow-up visits as told by your health care provider. This is important. Contact a health care provider if:  You keep feeling nauseous or you keep vomiting.  You feel light-headed.  You develop a rash.  You have a fever. Get help right away if:  You have trouble breathing. Summary  For several hours after your procedure, you may feel sleepy and have poor judgment.  Have a responsible adult stay with you for at least 24 hours or until you are awake and alert. This  information is not intended to replace advice given to you by your health care provider. Make sure you discuss any questions you have with your health care provider. Document Released: 06/14/2015 Document Revised: 10/07/2016 Document Reviewed: 06/14/2015 Elsevier Interactive Patient Education  2019 [employer].

## 2018-08-30 ENCOUNTER — Encounter (HOSPITAL_COMMUNITY)
Admission: RE | Admit: 2018-08-30 | Discharge: 2018-08-30 | Disposition: A | Discharge status: Home / Self Care | Payer: Medicare Other | Source: Ambulatory Visit | Attending: Internal Medicine | Admitting: Internal Medicine

## 2018-08-30 ENCOUNTER — Other Ambulatory Visit: Payer: Self-pay

## 2018-08-30 ENCOUNTER — Encounter (HOSPITAL_COMMUNITY): Payer: Self-pay

## 2018-08-30 ENCOUNTER — Other Ambulatory Visit (HOSPITAL_COMMUNITY)
Admission: RE | Admit: 2018-08-30 | Discharge: 2018-08-30 | Disposition: A | Discharge status: Home / Self Care | Payer: Medicare Other | Source: Ambulatory Visit | Attending: Internal Medicine | Admitting: Internal Medicine

## 2018-08-30 DIAGNOSIS — Z1159 Encounter for screening for other viral diseases: Secondary | ICD-10-CM | POA: Insufficient documentation

## 2018-08-30 DIAGNOSIS — Z01812 Encounter for preprocedural laboratory examination: Secondary | ICD-10-CM | POA: Diagnosis not present

## 2018-08-30 HISTORY — DX: Personal history of urinary calculi: Z87.442

## 2018-08-30 LAB — BASIC METABOLIC PANEL
Anion gap: 10 (ref 5–15)
BUN: 22 mg/dL (ref 8–23)
CO2: 25 mmol/L (ref 22–32)
Calcium: 9 mg/dL (ref 8.9–10.3)
Chloride: 101 mmol/L (ref 98–111)
Creatinine, Ser: 1.2 mg/dL (ref 0.61–1.24)
GFR calc Af Amer: >60 mL/min (ref >60)
GFR calc non Af Amer: >60 mL/min (ref >60)
Glucose, Bld: 350 mg/dL — ABNORMAL HIGH (ref 70–99)
Potassium: 4.7 mmol/L (ref 3.5–5.1)
Sodium: 136 mmol/L (ref 135–145)

## 2018-08-30 NOTE — Pre-Procedure Instructions (Signed)
Dr Currie Paris aware of cardiac and most recent stroke history as well as patient having loop recorder. Patient does not need to turn off recorder for this procedure. Okay to proceed with procedure. No orders given.

## 2018-09-01 LAB — NOVEL CORONAVIRUS, NAA (HOSP ORDER, SEND-OUT TO REF LAB; TAT 18-24 HRS): SARS-CoV-2, NAA: NOT DETECTED

## 2018-09-03 ENCOUNTER — Encounter (HOSPITAL_COMMUNITY): Payer: Self-pay | Admitting: *Deleted

## 2018-09-03 ENCOUNTER — Ambulatory Visit (HOSPITAL_COMMUNITY): Payer: Medicare Other | Admitting: Anesthesiology

## 2018-09-03 ENCOUNTER — Encounter (HOSPITAL_COMMUNITY)
Admission: RE | Disposition: A | Discharge status: Home / Self Care | Payer: Self-pay | Source: Home / Self Care | Attending: Internal Medicine

## 2018-09-03 ENCOUNTER — Other Ambulatory Visit: Payer: Self-pay

## 2018-09-03 ENCOUNTER — Ambulatory Visit (HOSPITAL_COMMUNITY)
Admission: RE | Admit: 2018-09-03 | Discharge: 2018-09-03 | Disposition: A | Discharge status: Home / Self Care | Payer: Medicare Other | Attending: Internal Medicine | Admitting: Internal Medicine

## 2018-09-03 DIAGNOSIS — E7800 Pure hypercholesterolemia, unspecified: Secondary | ICD-10-CM | POA: Diagnosis not present

## 2018-09-03 DIAGNOSIS — Z794 Long term (current) use of insulin: Secondary | ICD-10-CM | POA: Diagnosis not present

## 2018-09-03 DIAGNOSIS — Z87891 Personal history of nicotine dependence: Secondary | ICD-10-CM | POA: Diagnosis not present

## 2018-09-03 DIAGNOSIS — K635 Polyp of colon: Secondary | ICD-10-CM | POA: Insufficient documentation

## 2018-09-03 DIAGNOSIS — Z7901 Long term (current) use of anticoagulants: Secondary | ICD-10-CM | POA: Insufficient documentation

## 2018-09-03 DIAGNOSIS — K219 Gastro-esophageal reflux disease without esophagitis: Secondary | ICD-10-CM | POA: Insufficient documentation

## 2018-09-03 DIAGNOSIS — Z7982 Long term (current) use of aspirin: Secondary | ICD-10-CM | POA: Insufficient documentation

## 2018-09-03 DIAGNOSIS — E1151 Type 2 diabetes mellitus with diabetic peripheral angiopathy without gangrene: Secondary | ICD-10-CM | POA: Insufficient documentation

## 2018-09-03 DIAGNOSIS — Z8673 Personal history of transient ischemic attack (TIA), and cerebral infarction without residual deficits: Secondary | ICD-10-CM | POA: Diagnosis not present

## 2018-09-03 DIAGNOSIS — D122 Benign neoplasm of ascending colon: Secondary | ICD-10-CM | POA: Insufficient documentation

## 2018-09-03 DIAGNOSIS — Z79899 Other long term (current) drug therapy: Secondary | ICD-10-CM | POA: Insufficient documentation

## 2018-09-03 DIAGNOSIS — K5730 Diverticulosis of large intestine without perforation or abscess without bleeding: Secondary | ICD-10-CM | POA: Diagnosis not present

## 2018-09-03 DIAGNOSIS — N400 Enlarged prostate without lower urinary tract symptoms: Secondary | ICD-10-CM | POA: Diagnosis not present

## 2018-09-03 DIAGNOSIS — Z1211 Encounter for screening for malignant neoplasm of colon: Secondary | ICD-10-CM | POA: Diagnosis not present

## 2018-09-03 DIAGNOSIS — Z86010 Personal history of colonic polyps: Secondary | ICD-10-CM | POA: Diagnosis not present

## 2018-09-03 DIAGNOSIS — I10 Essential (primary) hypertension: Secondary | ICD-10-CM | POA: Diagnosis not present

## 2018-09-03 DIAGNOSIS — D126 Benign neoplasm of colon, unspecified: Secondary | ICD-10-CM

## 2018-09-03 HISTORY — PX: POLYPECTOMY: SHX5525

## 2018-09-03 HISTORY — PX: COLONOSCOPY WITH PROPOFOL: SHX5780

## 2018-09-03 LAB — GLUCOSE, CAPILLARY: Glucose-Capillary: 121 mg/dL — ABNORMAL HIGH (ref 70–99)

## 2018-09-03 SURGERY — COLONOSCOPY WITH PROPOFOL
Anesthesia: General

## 2018-09-03 MED ORDER — PROMETHAZINE HCL 25 MG/ML IJ SOLN: 6.2500 mg | INTRAMUSCULAR | Status: DC | PRN

## 2018-09-03 MED ORDER — LACTATED RINGERS IV SOLN
INTRAVENOUS | Status: DC
  Administered 2018-09-03: 1000 mL via INTRAVENOUS

## 2018-09-03 MED ORDER — PROPOFOL 500 MG/50ML IV EMUL
INTRAVENOUS | Status: DC | PRN
  Administered 2018-09-03: 150 ug/kg/min via INTRAVENOUS

## 2018-09-03 MED ORDER — MIDAZOLAM HCL 2 MG/2ML IJ SOLN: 0.5000 mg | Freq: Once | INTRAMUSCULAR | Status: DC | PRN

## 2018-09-03 MED ORDER — PROPOFOL 10 MG/ML IV BOLUS
INTRAVENOUS | Status: DC | PRN
  Administered 2018-09-03: 20 mg via INTRAVENOUS
  Administered 2018-09-03: 30 mg via INTRAVENOUS

## 2018-09-03 MED ORDER — CHLORHEXIDINE GLUCONATE CLOTH 2 % EX PADS: 6.0000 | MEDICATED_PAD | Freq: Once | CUTANEOUS | Status: DC

## 2018-09-03 NOTE — Op Note (Signed)
[facility] Patient Name: Jack Mcbride Procedure Date: 09/03/2018 9:35 AM MRN: [id number] Date of Birth: 07-Jun-1950 Attending MD: Norvel Richards , MD CSN: [id number] Age: 68 Admit Type: Outpatient Procedure:                Colonoscopy Indications:              High risk colon cancer surveillance: Personal                            history of colonic polyps Providers:                Norvel Richards, MD, Otis Peak B. Sharon Seller, RN,                            Randa Spike, Technician Referring MD:              Medicines:                Propofol per Anesthesia Complications:            No immediate complications. Estimated Blood Loss:     Estimated blood loss was minimal. Procedure:                Pre-Anesthesia Assessment:                           - Prior to the procedure, a History and Physical                            was performed, and patient medications and                            allergies were reviewed. The patient's tolerance of                            previous anesthesia was also reviewed. The risks                            and benefits of the procedure and the sedation                            options and risks were discussed with the patient.                            All questions were answered, and informed consent                            was obtained. Prior Anticoagulants: The patient                            last took Eliquis (apixaban) on the day of the                            procedure. ASA Grade Assessment: III - A patient  with severe systemic disease. After reviewing the                            risks and benefits, the patient was deemed in                            satisfactory condition to undergo the procedure.                           After obtaining informed consent, the colonoscope                            was passed under direct vision. Throughout the                            procedure,  the patient's blood pressure, pulse, and                            oxygen saturations were monitored continuously. The                            CF-[id number] ([id number]) scope was introduced through                            the anus and advanced to the the cecum, identified                            by appendiceal orifice and ileocecal valve. The                            colonoscopy was performed without difficulty. The                            patient tolerated the procedure well. The quality                            of the bowel preparation was adequate. The                            ileocecal valve, appendiceal orifice, and rectum                            were photographed. The colonoscopy was performed                            without difficulty. The patient tolerated the                            procedure well. The quality of the bowel                            preparation was adequate. Scope In: 10:59:24 AM Scope Out: 11:17:56 AM Scope Withdrawal Time: 0 hours 15 minutes 6 seconds  Total Procedure Duration: 0 hours 18  minutes 32 seconds  Findings:      The perianal and digital rectal examinations were normal.      Scattered medium-mouthed diverticula were found in the sigmoid colon and       descending colon.      Two sessile polyps were found in the ascending colon and cecum. The       polyps were 2 to 5 mm in size. These polyps were removed with a cold       snare. Resection and retrieval were complete. Estimated blood loss was       minimal.      The exam was otherwise without abnormality on direct and retroflexion       views. Site of prior piecemeal polypectomy site identified by tattoo in       the cecum. No evidence of digital polyp at this location. Impression:               - Diverticulosis in the sigmoid colon and in the                            descending colon.                           - Two 2 to 5 mm polyps in the ascending colon and                             in the cecum, removed with a cold snare. Resected                            and retrieved.                           - The examination was otherwise normal on direct                            and retroflexion views. Moderate Sedation:      Moderate (conscious) sedation was personally administered by an       anesthesia professional. The following parameters were monitored: oxygen       saturation, heart rate, blood pressure, respiratory rate, EKG, adequacy       of pulmonary ventilation, and response to care. Recommendation:           - Patient has a contact number available for                            emergencies. The signs and symptoms of potential                            delayed complications were discussed with the                            patient. Return to normal activities tomorrow.                            Written discharge instructions were provided to the  patient.                           - Resume previous diet.                           - Continue present medications.                           - Repeat colonoscopy date to be determined after                            pending pathology results are reviewed for                            surveillance based on pathology results.                           - Return to GI office (date not yet determined). Procedure Code(s):        --- Professional ---                           [phone], Colonoscopy, flexible; with removal of                            tumor(s), polyp(s), or other lesion(s) by snare                            technique Diagnosis Code(s):        --- Professional ---                           Z86.010, Personal history of colonic polyps                           K63.5, Polyp of colon                           K57.30, Diverticulosis of large intestine without                            perforation or abscess without bleeding CPT copyright 2019 American Medical Association.  All rights reserved. The codes documented in this report are preliminary and upon coder review may  be revised to meet current compliance requirements. Cristopher Estimable. Lenzi Marmo, MD Norvel Richards, MD 09/03/2018 11:37:04 AM This report has been signed electronically. Number of Addenda: 0

## 2018-09-03 NOTE — Transfer of Care (Signed)
Immediate Anesthesia Transfer of Care Note  Patient: Jack Mcbride  Procedure(s) Performed: COLONOSCOPY WITH PROPOFOL (N/A ) POLYPECTOMY  Patient Location: PACU  Anesthesia Type:General  Level of Consciousness: awake  Airway & Oxygen Therapy: Patient Spontanous Breathing  Post-op Assessment: Report given to RN  Post vital signs: Reviewed and stable  Last Vitals:  Vitals Value Taken Time  BP 106/62 09/03/18 1127  Temp 36.6 C 09/03/18 1127  Pulse 67 09/03/18 1129  Resp 16 09/03/18 1129  SpO2 99 % 09/03/18 1129  Vitals shown include unvalidated device data.  Last Pain:  Vitals:   09/03/18 1127  TempSrc:   PainSc: (P) 0-No pain      Patients Stated Pain Goal: (P) 8 (40/68/40 3353)  Complications: No apparent anesthesia complications

## 2018-09-03 NOTE — Discharge Instructions (Signed)
Colonoscopy Discharge Instructions  Read the instructions outlined below and refer to this sheet in the next few weeks. These discharge instructions provide you with general information on caring for yourself after you leave the hospital. Your doctor may also give you specific instructions. While your treatment has been planned according to the most current medical practices available, unavoidable complications occasionally occur. If you have any problems or questions after discharge, call Dr. Gala Romney at [phone]. ACTIVITY  You may resume your regular activity, but move at a slower pace for the next 24 hours.   Take frequent rest periods for the next 24 hours.   Walking will help get rid of the air and reduce the bloated feeling in your belly (abdomen).   No driving for 24 hours (because of the medicine (anesthesia) used during the test).    Do not sign any important legal documents or operate any machinery for 24 hours (because of the anesthesia used during the test).    Monitored Anesthesia Care, Care After These instructions provide you with information about caring for yourself after your procedure. Your health care provider may also give you more specific instructions. Your treatment has been planned according to current medical practices, but problems sometimes occur. Call your health care provider if you have any problems or questions after your procedure. What can I expect after the procedure? After your procedure, you may:  Feel sleepy for several hours.  Feel clumsy and have poor balance for several hours.  Feel forgetful about what happened after the procedure.  Have poor judgment for several hours.  Feel nauseous or vomit.  Have a sore throat if you had a breathing tube during the procedure. Follow these instructions at home: For at least 24 hours after the procedure:      Have a responsible adult stay with you. It is important to have someone help care for you  until you are awake and alert.  Rest as needed.  Do not: ? Participate in activities in which you could fall or become injured. ? Drive. ? Use heavy machinery. ? Drink alcohol. ? Take sleeping pills or medicines that cause drowsiness. ? Make important decisions or sign legal documents. ? Take care of children on your own. Eating and drinking  Follow the diet that is recommended by your health care provider.  If you vomit, drink water, juice, or soup when you can drink without vomiting.  Make sure you have little or no nausea before eating solid foods. General instructions  Take over-the-counter and prescription medicines only as told by your health care provider.  If you have sleep apnea, surgery and certain medicines can increase your risk for breathing problems. Follow instructions from your health care provider about wearing your sleep device: ? Anytime you are sleeping, including during daytime naps. ? While taking prescription pain medicines, sleeping medicines, or medicines that make you drowsy.  If you smoke, do not smoke without supervision.  Keep all follow-up visits as told by your health care provider. This is important. Contact a health care provider if:  You keep feeling nauseous or you keep vomiting.  You feel light-headed.  You develop a rash.  You have a fever. Get help right away if:  You have trouble breathing. Summary  For several hours after your procedure, you may feel sleepy and have poor judgment.  Have a responsible adult stay with you for at least 24 hours or until you are awake and alert. This information is not intended  to replace advice given to you by your health care provider. Make sure you discuss any questions you have with your health care provider. Document Released: 06/14/2015 Document Revised: 05/22/2017 Document Reviewed: 06/14/2015 Elsevier Patient Education  2020 [place]  Drink plenty of fluids.   You may  resume your normal diet as instructed by your doctor.   Begin with a light meal and progress to your normal diet. Heavy or fried foods are harder to digest and may make you feel sick to your stomach (nauseated).   Avoid alcoholic beverages for 24 hours or as instructed.  MEDICATIONS  You may resume your normal medications unless your doctor tells you otherwise.  WHAT YOU CAN EXPECT TODAY  Some feelings of bloating in the abdomen.   Passage of more gas than usual.   Spotting of blood in your stool or on the toilet paper.  IF YOU HAD POLYPS REMOVED DURING THE COLONOSCOPY:  No aspirin products for 7 days or as instructed.   No alcohol for 7 days or as instructed.   Eat a soft diet for the next 24 hours.  FINDING OUT THE RESULTS OF YOUR TEST Not all test results are available during your visit. If your test results are not back during the visit, make an appointment with your caregiver to find out the results. Do not assume everything is normal if you have not heard from your caregiver or the medical facility. It is important for you to follow up on all of your test results.  SEEK IMMEDIATE MEDICAL ATTENTION IF:  You have more than a spotting of blood in your stool.   Your belly is swollen (abdominal distention).   You are nauseated or vomiting.   You have a temperature over 101.   You have abdominal pain or discomfort that is severe or gets worse throughout the day.    Colon polyp diverticulosis information provided  Continue Eliquis  Further recommendations to follow pending review of pathology report  At patient request I discussed my findings and recommendations with Gilford Rile at [phone]

## 2018-09-03 NOTE — Anesthesia Postprocedure Evaluation (Signed)
Anesthesia Post Note  Patient: Jack Mcbride  Procedure(s) Performed: COLONOSCOPY WITH PROPOFOL (N/A ) POLYPECTOMY  Patient location during evaluation: PACU Anesthesia Type: General Level of consciousness: awake and alert Pain management: pain level controlled Vital Signs Assessment: post-procedure vital signs reviewed and stable Respiratory status: spontaneous breathing Cardiovascular status: blood pressure returned to baseline and stable Postop Assessment: no apparent nausea or vomiting Anesthetic complications: no     Last Vitals:  Vitals:   09/03/18 1127 09/03/18 1130  BP: 106/62 113/63  Pulse: 72 66  Resp: 16 16  Temp: 36.6 C   SpO2: 99% 98%    Last Pain:  Vitals:   09/03/18 1127  TempSrc:   PainSc: 0-No pain                 Yolanda Dockendorf

## 2018-09-03 NOTE — Interval H&P Note (Signed)
History and Physical Interval Note:  09/03/2018 10:44 AM  Jack Mcbride  has presented today for surgery, with the diagnosis of history of adenomatous colon polyps.  The various methods of treatment have been discussed with the patient and family. After consideration of risks, benefits and other options for treatment, the patient has consented to  Procedure(s) with comments: COLONOSCOPY WITH PROPOFOL (N/A) - 10:00am as a surgical intervention.  The patient's history has been reviewed, patient examined, no change in status, stable for surgery.  I have reviewed the patient's chart and labs.  Questions were answered to the patient's satisfaction.     Jack Mcbride  No change.  Surveillance colonoscopy per plan.  Given clinical circumstances, he will be allowed been allowed to stay on Eliquis for this procedure.  The risks, benefits, limitations, alternatives and imponderables have been reviewed with the patient. Questions have been answered. All parties are agreeable.

## 2018-09-03 NOTE — Anesthesia Preprocedure Evaluation (Signed)
Anesthesia Evaluation  Patient identified by MRN, date of birth, ID band Patient awake    Reviewed: Allergy & Precautions, NPO status , Patient's Chart, lab work & pertinent test results, reviewed documented beta blocker date and time   Airway Mallampati: I  TM Distance: >3 FB Neck ROM: Full    Dental no notable dental hx. (+) Teeth Intact   Pulmonary neg pulmonary ROS, former smoker,    Pulmonary exam normal breath sounds clear to auscultation       Cardiovascular Exercise Tolerance: Good hypertension, Pt. on home beta blockers and Pt. 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  Uses cane CVA, Residual Symptoms negative psych ROS   GI/Hepatic Neg liver ROS, GERD  Medicated and Controlled,Denies GERD today    Endo/Other  negative endocrine ROSdiabetes  Renal/GU Renal InsufficiencyRenal disease  negative genitourinary   Musculoskeletal negative musculoskeletal ROS (+)   Abdominal   Peds negative pediatric ROS (+)  Hematology negative hematology ROS (+)   Anesthesia Other Findings   Reproductive/Obstetrics negative OB ROS                             Anesthesia Physical Anesthesia Plan  ASA: III  Anesthesia Plan: General   Post-op Pain Management:    Induction: Intravenous  PONV Risk Score and Plan: 2 and Propofol infusion  Airway Management Planned: Nasal Cannula and Simple Face Mask  Additional Equipment:   Intra-op Plan:   Post-operative Plan:   Informed Consent: I have reviewed the patients History and Physical, chart, labs and discussed the procedure including the risks, benefits and alternatives for the proposed  anesthesia with the patient or authorized representative who has indicated his/her understanding and acceptance.     Dental advisory given  Plan Discussed with: CRNA  Anesthesia Plan Comments: (Plan Full PPE use  Plan Ga with GETA back up as needed -WTP with same after Q&A)        Anesthesia Quick Evaluation

## 2018-09-05 ENCOUNTER — Encounter: Payer: Self-pay | Admitting: Internal Medicine

## 2018-09-06 ENCOUNTER — Encounter (HOSPITAL_COMMUNITY): Payer: Self-pay | Admitting: Internal Medicine

## 2019-02-25 ENCOUNTER — Other Ambulatory Visit: Payer: Self-pay | Admitting: Nurse Practitioner

## 2019-02-25 DIAGNOSIS — K5900 Constipation, unspecified: Secondary | ICD-10-CM

## 2019-05-20 ENCOUNTER — Ambulatory Visit (INDEPENDENT_AMBULATORY_CARE_PROVIDER_SITE_OTHER): Payer: Medicare Other | Admitting: Vascular Surgery

## 2019-05-20 ENCOUNTER — Encounter (INDEPENDENT_AMBULATORY_CARE_PROVIDER_SITE_OTHER): Payer: Medicare Other

## 2019-06-10 ENCOUNTER — Encounter (INDEPENDENT_AMBULATORY_CARE_PROVIDER_SITE_OTHER): Payer: Self-pay | Admitting: Vascular Surgery

## 2019-06-10 ENCOUNTER — Ambulatory Visit (INDEPENDENT_AMBULATORY_CARE_PROVIDER_SITE_OTHER): Payer: Medicare Other

## 2019-06-10 ENCOUNTER — Ambulatory Visit (INDEPENDENT_AMBULATORY_CARE_PROVIDER_SITE_OTHER): Payer: Medicare Other | Admitting: Vascular Surgery

## 2019-06-10 ENCOUNTER — Other Ambulatory Visit: Payer: Self-pay

## 2019-06-10 VITALS — BP 138/74 | HR 75 | Resp 12 | Ht 69.0 in | Wt 177.0 lb

## 2019-06-10 DIAGNOSIS — K219 Gastro-esophageal reflux disease without esophagitis: Secondary | ICD-10-CM

## 2019-06-10 DIAGNOSIS — I701 Atherosclerosis of renal artery: Secondary | ICD-10-CM

## 2019-06-10 DIAGNOSIS — I70213 Atherosclerosis of native arteries of extremities with intermittent claudication, bilateral legs: Secondary | ICD-10-CM

## 2019-06-10 DIAGNOSIS — E782 Mixed hyperlipidemia: Secondary | ICD-10-CM

## 2019-06-10 DIAGNOSIS — I10 Essential (primary) hypertension: Secondary | ICD-10-CM | POA: Diagnosis not present

## 2019-06-10 NOTE — Progress Notes (Signed)
MRN : [id number]  Jack Mcbride is a 69 y.o. (22-Nov-1950) male who presents with chief complaint of  Chief Complaint  Patient presents with  . Follow-up    u/s Follow up  .  History of Present Illness:   The patient is seen for evaluation of painful lower extremities, right greater than left as well as right renal artery stenosis.  Patient notes the leg pain is variable and worse with standing right leg is worse than the left and it gets worse with activity.  The pain is somewhat consistent day to day occurring on most days. The pain has been progressive over the past year.  He notes that it is somewhat better if he is pushing a shopping cart.  No open wounds or sores at this time.  The patient is also seen for evaluation of renal artery stenosis incidentally found on CT scan in the remote past.  He does admit to difficult to control  hypertension. The patient has a long history of hypertension which recently has become increasingly difficult to control utilizing medical therapy.   The patient does have family history of hypertension.   There is no prior documented abdominal bruit. The patient occasionally has flushing symptoms but denies palpitations. No episodes of syncope.There is no history of headache. There is no history of flash pulmonary edema.  The patient denies a history of renal disease.  The patient denies amaurosis fugax or recent TIA symptoms. There are no recent neurological changes noted. The patient denies claudication symptoms or rest pain symptoms. The patient denies history of DVT, PE or superficial thrombophlebitis. The patient denies recent episodes of angina or shortness of breath.   Current Meds  Medication Sig  . AMITIZA 8 MCG capsule TAKE 1 CAPSULE BY MOUTH TWICE A DAY  . amLODipine-benazepril (LOTREL) 5-20 MG capsule Take 1 capsule by mouth daily.  Marland Kitchen apixaban (ELIQUIS) 5 MG TABS tablet Take 5 mg by mouth 2 (two) times daily.  Marland Kitchen aspirin 81 MG  tablet Take 81 mg by mouth daily.   Marland Kitchen atorvastatin (LIPITOR) 80 MG tablet Take 80 mg by mouth 3 (three) times a week. Taking 3 times weekly  . ciclopirox (PENLAC) 8 % solution ciclopirox 8 % topical solution  APPLY TOPICALLY TO AFFECTED AREA EVERY DAY  . furosemide (LASIX) 20 MG tablet Take 20 mg by mouth daily as needed for edema. Pt can take 1 tab for 3 days as needed for ankle swelling   . LEVEMIR FLEXTOUCH 100 UNIT/ML Pen Inject 27 Units into the skin at bedtime.   . metoprolol succinate (TOPROL-XL) 25 MG 24 hr tablet Take 25 mg by mouth daily.  . pantoprazole (PROTONIX) 40 MG tablet TAKE 1 TABLET BY MOUTH 2 TIMES DAILY BEFORE A MEAL.  Marland Kitchen sertraline (ZOLOFT) 50 MG tablet Take 50 mg by mouth daily.  . traZODone (DESYREL) 50 MG tablet Take 50 mg by mouth at bedtime.   . vitamin B-12 (CYANOCOBALAMIN) 1000 MCG tablet Take 1,000 mcg by mouth daily.    Past Medical History:  Diagnosis Date  . Benign prostate hyperplasia   . Diabetes ([place])   . Elevated PSA   . GERD (gastroesophageal reflux disease)   . History of kidney stones   . Hypercholesterolemia   . Impotence of organic origin   . Kidney stone   . PAD (peripheral artery disease) ([place])   . Peripheral artery disease ([place])    s/p stenting  . Skin cancer    on  nose  . Stroke (cerebrum) ([place])   . Stroke [facility])    2013 and 2017; 2019  . Urolithiasis     Past Surgical History:  Procedure Laterality Date  . BACK SURGERY  05/2016  . BIOPSY  12/28/2017   Procedure: BIOPSY;  Surgeon: Daneil Dolin, MD;  [facility];  Service: Endoscopy;;  gastric bx's  . COLONOSCOPY WITH PROPOFOL N/A 11/20/2017   Dr. Gala Romney: Diverticulosis,two 5 to 30 mm polyps in the cecum and rectum.  Cecal polypectomy site tattooed.  Pathology revealed tubular adenoma, no high-grade dysplasia.  Recommend colonoscopy in 6 months.  . COLONOSCOPY WITH PROPOFOL N/A 09/03/2018   Procedure: COLONOSCOPY WITH PROPOFOL;  Surgeon: Daneil Dolin, MD;  Location:  [facility];  Service: Endoscopy;  Laterality: N/A;  10:00am  . ESOPHAGOGASTRODUODENOSCOPY (EGD) WITH PROPOFOL N/A 12/28/2017   Dr. Gala Romney: Normal esophagus, erosive gastropathy, duodenal erosions without bleeding.  Gastric biopsy benign.  No H. pylori on biopsy.  Marland Kitchen HERNIA REPAIR     umbilical  . LOOP RECORDER INSERTION  06/27/2017  . PERCUTANEOUS STENT INTERVENTION Left    Left leg for PAD  . POLYPECTOMY  11/20/2017   Procedure: POLYPECTOMY;  Surgeon: Daneil Dolin, MD;  Location: [facility];  Service: Endoscopy;;  cecal  . POLYPECTOMY  09/03/2018   Procedure: POLYPECTOMY;  Surgeon: Daneil Dolin, MD;  Location[facility];  Service: Endoscopy;;  colon  . SHOULDER SURGERY Right    torn rotator cuff    Social History Social History   Tobacco Use  . Smoking status: Former Smoker    Packs/day: 0.25    Years: 48.00    Pack years: 12.00    Types: Cigarettes    Start date: 03/07/1969    Quit date: 04/03/2018    Years since quitting: 1.1  . Smokeless tobacco: Never Used  Substance Use Topics  . Alcohol use: No  . Drug use: No    Family History Family History  Problem Relation Age of Onset  . Heart attack Mother   . Diabetes Father   . Colon cancer Neg Hx   . Gastric cancer Neg Hx   . Esophageal cancer Neg Hx     Allergies  Allergen Reactions  . Codeine Itching     REVIEW OF SYSTEMS (Negative unless checked)  Constitutional: [] Weight loss  [] Fever  [] Chills Cardiac: [] Chest pain   [] Chest pressure   [] Palpitations   [] Shortness of breath when laying flat   [] Shortness of breath with exertion. Vascular:  [x] Pain in legs with walking   [x] Pain in legs at rest  [] History of DVT   [] Phlebitis   [] Swelling in legs   [] Varicose veins   [] Non-healing ulcers Pulmonary:   [] Uses home oxygen   [] Productive cough   [] Hemoptysis   [] Wheeze  [] COPD   [] Asthma Neurologic:  [] Dizziness   [] Seizures   [] History of stroke   [] History of TIA  [] Aphasia   [] Vissual changes    [] Weakness or numbness in arm   [] Weakness or numbness in leg Musculoskeletal:   [] Joint swelling   [] Joint pain   [] Low back pain Hematologic:  [] Easy bruising  [] Easy bleeding   [] Hypercoagulable state   [] Anemic Gastrointestinal:  [] Diarrhea   [] Vomiting  [x] Gastroesophageal reflux/heartburn   [] Difficulty swallowing. Genitourinary:  [] Chronic kidney disease   [] Difficult urination  [] Frequent urination   [] Blood in urine Skin:  [] Rashes   [] Ulcers  Psychological:  [] History of anxiety   []  History  of major depression.  Physical Examination  Vitals:   06/10/19 0841  BP: 138/74  Pulse: 75  Resp: 12  Weight: 177 lb (80.3 kg)  Height: 5\' 9"  (1.753 m)   Body mass index is 26.14 kg/m. Gen: WD/WN, NAD Head: [place]/AT, No temporalis wasting.  Ear/Nose/Throat: Hearing grossly intact, nares w/o erythema or drainage Eyes: PER, EOMI, sclera nonicteric.  Neck: Supple, no large masses.   Pulmonary:  Good air movement, no audible wheezing bilaterally, no use of accessory muscles.  Cardiac: RRR, no JVD Vascular:  Vessel Right Left  Radial Palpable Palpable  PT Not Palpable Not Palpable  DP 1+ Palpable Trace Palpable  Gastrointestinal: Non-distended. No guarding/no peritoneal signs.  Musculoskeletal: M/S 5/5 throughout.  No deformity or atrophy.  Neurologic: CN 2-12 intact. Symmetrical.  Speech is fluent. Motor exam as listed above. Psychiatric: Judgment intact, Mood & affect appropriate for pt's clinical situation. Dermatologic: No rashes or ulcers noted.  No changes consistent with cellulitis.   CBC Lab Results  Component Value Date   WBC 11.0 (H) 11/13/2017   HGB 13.3 11/13/2017   HCT 40.0 11/13/2017   MCV 92.6 11/13/2017   PLT 124 (L) 11/13/2017    BMET    Component Value Date/Time   NA 136 08/30/2018 1416   K 4.7 08/30/2018 1416   CL 101 08/30/2018 1416   CO2 25 08/30/2018 1416   GLUCOSE 350 (H) 08/30/2018 1416   BUN 22 08/30/2018 1416   CREATININE 1.20 08/30/2018 1416     CREATININE 1.11 01/12/2018 1012   CALCIUM 9.0 08/30/2018 1416   GFRNONAA >60 08/30/2018 1416   GFRAA >60 08/30/2018 1416   CrCl cannot be calculated (Patient's most recent lab result is older than the maximum 21 days allowed.).  COAG No results found for: INR, PROTIME  Radiology No results found.   Assessment/Plan 1. Atherosclerosis of native artery of both lower extremities with intermittent claudication (HCC)  Recommend:  The patient has evidence of atherosclerosis of the lower extremities with claudication.  The patient does not voice lifestyle limiting changes at this point in time.  Noninvasive studies do not suggest clinically significant change.  No invasive studies, angiography or surgery at this time The patient should continue walking and begin a more formal exercise program.  The patient should continue antiplatelet therapy and aggressive treatment of the lipid abnormalities  No changes in the patient's medications at this time  The patient should continue wearing graduated compression socks 10-15 mmHg strength to control the mild edema.   - VAS [place] ABI WITH/WO TBI; Future  2. Right renal artery stenosis (HCC) Given patient's arterial disease optimal control of the patient's hypertension is important. BP is acceptable today  The patient's vital signs and noninvasive studies support the renal artery stenosis is not significantly increased when compared to the previous study.  No invasive studies or intervention is indicated at this time.  The patient will continue the current antihypertensive medications, no changes at this time.  The primary medical service will continue aggressive antihypertensive therapy as per the AHA guidelines  - VAS US RENAL ARTERY DUPLEX; Future  3. Essential hypertension Continue antihypertensive medications as already ordered, these medications have been reviewed and there are no changes at this time.   4. Mixed  hyperlipidemia Continue statin as ordered and reviewed, no changes at this time   5. Gastroesophageal reflux disease without esophagitis Continue PPI as already ordered, this medication has been reviewed and there are no changes at this time.  Avoidence of caffeine and alcohol  Moderate elevation of the head of the bed     Hortencia Pilar, MD  06/10/2019 9:01 AM

## 2019-06-11 ENCOUNTER — Other Ambulatory Visit: Payer: Self-pay | Admitting: Nurse Practitioner

## 2019-07-25 ENCOUNTER — Other Ambulatory Visit: Payer: Self-pay

## 2019-07-25 ENCOUNTER — Telehealth (INDEPENDENT_AMBULATORY_CARE_PROVIDER_SITE_OTHER): Payer: Medicare Other | Admitting: Nurse Practitioner

## 2019-07-25 ENCOUNTER — Encounter: Payer: Self-pay | Admitting: Nurse Practitioner

## 2019-07-25 DIAGNOSIS — R112 Nausea with vomiting, unspecified: Secondary | ICD-10-CM | POA: Insufficient documentation

## 2019-07-25 DIAGNOSIS — K5900 Constipation, unspecified: Secondary | ICD-10-CM | POA: Diagnosis not present

## 2019-07-25 DIAGNOSIS — K219 Gastro-esophageal reflux disease without esophagitis: Secondary | ICD-10-CM

## 2019-07-25 DIAGNOSIS — I70213 Atherosclerosis of native arteries of extremities with intermittent claudication, bilateral legs: Secondary | ICD-10-CM

## 2019-07-25 MED ORDER — ONDANSETRON HCL 4 MG PO TABS: 4.0000 mg | ORAL_TABLET | Freq: Two times a day (BID) | ORAL | 3 refills | Status: DC

## 2019-07-25 MED ORDER — LUBIPROSTONE 8 MCG PO CAPS: 8.0000 ug | ORAL_CAPSULE | Freq: Two times a day (BID) | ORAL | 3 refills | Status: DC

## 2019-07-25 NOTE — Patient Instructions (Signed)
Your health issues we discussed today were:   GERD (reflux/heartburn): 1. I am glad your GERD symptoms are doing well 2. Continue taking Protonix twice daily as you have been 3. Call us for any worsening or severe symptoms  Constipation: 1. I have sent a new prescription to your pharmacy for Amitiza 8 mcg 2. Take this twice a day, with a meal/on a full stomach 3. Call us if you have any worsening or severe symptoms or if you do not get any improvement with Amitiza  Persistent, intermittent, unpredictable nausea/vomiting with diabetes: 1. As we discussed, diabetes and constipation can both cause some nausea and vomiting 2. Uncontrolled GERD can cause some nausea and vomiting as well, although I feel your GERD is likely well controlled 3. At this point I will check a gastric emptying test (stomach emptying test) to see if you have delayed gastric emptying contributing to your nausea and vomiting 4. I have sent in Zofran 4 mg to your pharmacy.  Take this twice a day, scheduled, to try to prevent nausea and vomiting 5. Try to write down your A1c results to have with you when we follow-up 6. Call for any worsening or severe symptoms  Overall I recommend:  1. Continue your other current medications 2. Return for follow-up in 4 to 6 weeks 3. Call us if you have any questions or concerns   ---------------------------------------------------------------  I am glad you have gotten your COVID-19 vaccination!  Even though you are fully vaccinated you should continue to follow CDC and state/local guidelines.  ---------------------------------------------------------------   At [facility] we value your feedback. You may receive a survey about your visit today. Please share your experience as we strive to create trusting relationships with our patients to provide genuine, compassionate, quality care.  We appreciate your understanding and patience as we review any laboratory  studies, imaging, and other diagnostic tests that are ordered as we care for you. Our office policy is 5 business days for review of these results, and any emergent or urgent results are addressed in a timely manner for your best interest. If you do not hear from our office in 1 week, please contact us.   We also encourage the use of MyChart, which contains your medical information for your review as well. If you are not enrolled in this feature, an access code is on this after visit summary for your convenience. Thank you for allowing [place] to be involved in your care.  It was great to see you today!  I hope you have a great Summer!!

## 2019-07-25 NOTE — Assessment & Plan Note (Signed)
Chronic constipation, previously did well on Amitiza 8 mcg twice daily.  He is having some struggles with his bowels.  Amitiza was at some point discontinued by one of his providers but he is not sure by who or why.  Persistent constipation could be causing some nausea and vomiting as well.  At this point I will restart him on Amitiza 8 mcg as he recalls that it did work pretty well for him.  Call for worsening or severe symptoms.  Follow-up in 4 to 6 weeks.

## 2019-07-25 NOTE — Assessment & Plan Note (Signed)
Denies overt GERD symptoms.  He is on Protonix twice daily.  While he could have persistent somewhat silent GERD, this is less likely given his twice daily PPI.  Continue to monitor, follow-up in 4 to 6 weeks.

## 2019-07-25 NOTE — Assessment & Plan Note (Signed)
The patient describes random, rapid onset vomiting that is typically preceded with some nausea, but not often very long.  He does have as needed nausea medication but he does not take this because generally he cannot predict when he will have his nausea and vomiting and by the time he takes it he is done.  Denies overt abdominal pain, change in stools, GERD symptoms.  He is on a PPI twice daily.  He does have a longstanding history of type 1 diabetes.  He states his blood sugars have been "all over the place recently" although his last hemoglobin A1c a month or 2 ago was around 7.  He is due to have this rechecked in the next 1 to 2 weeks.  I will be interested to see what his A1c result is.  Possible differentials include gastroparesis, persistent silent GERD, diabetes.  Less likely worsening gastritis, peptic ulcer disease, pyloric stenosis.  Somewhat recent EGD is reassuring.  At this point I will try to better control his constipation, order gastric emptying test, give him scheduled Zofran twice daily to see if we can prevent recurrent symptoms, follow-up in 4 to 6 weeks.

## 2019-07-25 NOTE — Progress Notes (Signed)
Referring Provider: Sherrilee Gilles, DO Primary Care Physician:  Sherrilee Gilles, DO Primary GI:  Dr. Gala Romney  NOTE: Service was provided via telemedicine and was requested by the patient due to COVID-19 pandemic.  Patient Location: Home  Provider Location: [place] office  Reason for Phone Visit: Vomiting  Persons present on the phone encounter, with roles: Patient, myself (provider), Mindy Estudillo (updated meds and allergies)  Total time (minutes) spent on medical discussion: 24 minutes  Due to COVID-19, visit was conducted using the virtual method noted. Visit was requested by patient.  I connected with Jack Mcbride on 07/25/19 at  1:30 PM EDT by video call and verified that I am speaking with the correct person using two identifiers.   I discussed the limitations, risks, security and privacy concerns of performing an evaluation and management service by telephone and the availability of in person appointments. I also discussed with the patient that there may be a patient responsible charge related to this service. The patient expressed understanding and agreed to proceed.  Chief Complaint  Patient presents with  . Emesis    comes on all of a sudden, for about 5-10 min    HPI:   Jack Mcbride is a 69 y.o. male who presents for virtual visit regarding: Vomiting.  Vision was last seen in our office 08/24/2018 for adenomatous colon polyp.  His last visit was a schedule a colonoscopy.  Previous exam September 2019 to evaluate elevated CEA with 2 polyps measuring 5 to 30 mm that were tubular adenoma with a planned 6-month surveillance colonoscopy.  Previously had complained of 6 months of intermittent vomiting associated with nausea that occurs weeks weekly with a 40 pound weight loss.  EGD in 2019 with erosive gastropathy, duodenal erosions without bleeding, gastric biopsies benign and no H. Pylori.  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  No evidence of chronic mesenteric ischemia.  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  On the left, this results in a focal moderate high-grade stenosis.  Midline ventral abdominal hernia containing a single loop of small bowel without evidence of wall thickening, obstruction or inflammation.  Prostatomegaly with irregular central enhancement.  Evaluated by vein and vascular and being followed due to CTA findings.  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  At his last visit he was doing much better with reflux completely controlled and improved appetite.  Weight stable and regular bowel movements.  No hematochezia or melena.  Recommended colonoscopy for surveillance.  Recommended colonoscopy on Eliquis due to recent CVA.  Colonoscopy completed 09/03/2018 which found diverticulosis in the sigmoid and descending colon, two polyps measuring 2 to 5 mm in the ascending colon and cecum found to be a mix of polypoid colonic mucosa and tubular adenoma on pathology, otherwise normal.  Recommended repeat colonoscopy in 5 years (2025).  Today he states he is doing okay overall.  He does have a history of diabetes, type I.  His blood sugars have been "all over the place."  He has been having rapid onset vomiting that lasts 5 to 10 minutes and then resolves. Cannot identify any triggers. This started about 3-4 months ago. This typically occurs about 2-3 times a week. He usually does  have "a little bit" nausea prior to vomiting. Denies early satiety. Has not tried any medications for his N/V. Denies hematemesis. Denies  abdominal pain, hematochezia, melena. Weight is stable. Appetite is good. Denies GERD symptoms. Denies fever, chills. Denies URI or flu-like symptoms. Denies loss of sense of taste or smell. The patient has received COVID-19 vaccination(s). Denies chest pain, dyspnea, dizziness, lightheadedness,  syncope, near syncope. Denies any other upper or lower GI symptoms.  Not on Amitiza anymore. Bowel movement about every 2-3 days. Has some hard stools and straining. On Senokot, not really helping. States Amitiza 8 mcg did work previously. Thinks another doctor stopped it, but can't remember who or why. Thinks his last a1c was around 7.  Past Medical History:  Diagnosis Date  . Benign prostate hyperplasia   . Diabetes ([place])   . Elevated PSA   . GERD (gastroesophageal reflux disease)   . History of kidney stones   . Hypercholesterolemia   . Impotence of organic origin   . Kidney stone   . PAD (peripheral artery disease) ([place])   . Peripheral artery disease ([place])    s/p stenting  . Skin cancer    on nose  . Stroke (cerebrum) ([place])   . Stroke [facility])    2013 and 2017; 2019  . Urolithiasis     Past Surgical History:  Procedure Laterality Date  . BACK SURGERY  05/2016  . BIOPSY  12/28/2017   Procedure: BIOPSY;  Surgeon: Daneil Dolin, MD;  [facility];  Service: Endoscopy;;  gastric bx's  . COLONOSCOPY WITH PROPOFOL N/A 11/20/2017   Dr. Gala Romney: Diverticulosis,two 5 to 30 mm polyps in the cecum and rectum.  Cecal polypectomy site tattooed.  Pathology revealed tubular adenoma, no high-grade dysplasia.  Recommend colonoscopy in 6 months.  . COLONOSCOPY WITH PROPOFOL N/A 09/03/2018   Procedure: COLONOSCOPY WITH PROPOFOL;  Surgeon: Daneil Dolin, MD;  [facility];  Service: Endoscopy;  Laterality: N/A;  10:00am  . ESOPHAGOGASTRODUODENOSCOPY (EGD) WITH PROPOFOL N/A 12/28/2017   Dr. Gala Romney: Normal esophagus, erosive gastropathy, duodenal erosions without bleeding.  Gastric biopsy benign.  No H. pylori on biopsy.  Marland Kitchen HERNIA REPAIR     umbilical  . LOOP RECORDER INSERTION  06/27/2017  . PERCUTANEOUS STENT INTERVENTION Left    Left leg for PAD  . POLYPECTOMY  11/20/2017   Procedure: POLYPECTOMY;  Surgeon: Daneil Dolin, MD;  [facility];  Service:  Endoscopy;;  cecal  . POLYPECTOMY  09/03/2018   Procedure: POLYPECTOMY;  Surgeon: Daneil Dolin, MD;  [facility];  Service: Endoscopy;;  colon  . SHOULDER SURGERY Right    torn rotator cuff    Current Outpatient Medications  Medication Sig Dispense Refill  . amLODipine-benazepril (LOTREL) 5-20 MG capsule Take 1 capsule by mouth daily.    Marland Kitchen apixaban (ELIQUIS) 5 MG TABS tablet Take 5 mg by mouth 2 (two) times daily.    Marland Kitchen aspirin 81 MG tablet Take 81 mg by mouth daily.     Marland Kitchen atorvastatin (LIPITOR) 80 MG tablet Take 80 mg by mouth 3 (three) times a week. Taking 3 times weekly    . ciclopirox (PENLAC) 8 % solution ciclopirox 8 % topical solution  APPLY TOPICALLY TO AFFECTED AREA EVERY DAY    . finasteride (PROSCAR) 5 MG tablet Take 5 mg by mouth at bedtime.  3  . furosemide (LASIX) 20 MG tablet Take 20 mg by mouth daily as needed for edema. Pt can take 1 tab for 3 days as needed for ankle swelling     . LEVEMIR FLEXTOUCH 100 UNIT/ML Pen Inject  16 Units into the skin at bedtime.   1  . metoprolol succinate (TOPROL-XL) 25 MG 24 hr tablet Take 25 mg by mouth daily.    . pantoprazole (PROTONIX) 40 MG tablet TAKE 1 TABLET BY MOUTH 2 TIMES DAILY BEFORE A MEAL. 180 tablet 1  . sertraline (ZOLOFT) 50 MG tablet Take 50 mg by mouth daily.    . tamsulosin (FLOMAX) 0.4 MG CAPS capsule Take 0.4 mg by mouth at bedtime.  3  . traZODone (DESYREL) 50 MG tablet Take 50 mg by mouth at bedtime.   1  . vitamin B-12 (CYANOCOBALAMIN) 1000 MCG tablet Take 1,000 mcg by mouth daily.    . AMITIZA 8 MCG capsule TAKE 1 CAPSULE BY MOUTH TWICE A DAY (Patient not taking: Reported on 07/25/2019) 180 capsule 1  . glimepiride (AMARYL) 2 MG tablet Take 2 mg by mouth 2 (two) times daily.    . metFORMIN (GLUCOPHAGE-XR) 500 MG 24 hr tablet Take 500 mg by mouth daily with breakfast.    . Multiple Vitamins-Minerals (AIRBORNE) CHEW Chew 3 each by mouth daily.    . niacin (NIASPAN) 500 MG CR tablet Take 500 mg by mouth  daily.     No current facility-administered medications for this visit.    Allergies as of 07/25/2019 - Review Complete 07/25/2019  Allergen Reaction Noted  . Codeine Itching 09/05/2011    Family History  Problem Relation Age of Onset  . Heart attack Mother   . Diabetes Father   . Colon cancer Neg Hx   . Gastric cancer Neg Hx   . Esophageal cancer Neg Hx     Social History   Socioeconomic History  . Marital status: Married    Spouse name: Not on file  . Number of children: Not on file  . Years of education: Not on file  . Highest education level: Not on file  Occupational History  . Not on file  Tobacco Use  . Smoking status: Former Smoker    Packs/day: 0.25    Years: 48.00    Pack years: 12.00    Types: Cigarettes    Start date: 03/07/1969    Quit date: 04/03/2018    Years since quitting: 1.3  . Smokeless tobacco: Never Used  Substance and Sexual Activity  . Alcohol use: No  . Drug use: No  . Sexual activity: Yes    Birth control/protection: None  Other Topics Concern  . Not on file  Social History Narrative  . Not on file   Social Determinants of Health   Financial Resource Strain:   . Difficulty of Paying Living Expenses:   Food Insecurity:   . Worried About Charity fundraiser in the Last Year:   . Arboriculturist in the Last Year:   Transportation Needs:   . Film/video editor (Medical):   Marland Kitchen Lack of Transportation (Non-Medical):   Physical Activity:   . Days of Exercise per Week:   . Minutes of Exercise per Session:   Stress:   . Feeling of Stress :   Social Connections:   . Frequency of Communication with Friends and Family:   . Frequency of Social Gatherings with Friends and Family:   . Attends Religious Services:   . Active Member of Clubs or Organizations:   . Attends Archivist Meetings:   Marland Kitchen Marital Status:     Review of Systems: Review of Systems  Constitutional: Negative for chills, fever, malaise/fatigue and weight  loss.  HENT: Negative for congestion and sore throat.   Respiratory: Negative for cough and shortness of breath.   Cardiovascular: Negative for chest pain and palpitations.  Gastrointestinal: Positive for constipation, nausea and vomiting. Negative for abdominal pain, blood in stool, diarrhea, heartburn and melena.  Musculoskeletal: Negative for joint pain and myalgias.  Skin: Negative for rash.  Neurological: Negative for dizziness and weakness.  Endo/Heme/Allergies: Does not bruise/bleed easily.  Psychiatric/Behavioral: Negative for depression. The patient is not nervous/anxious.   All other systems reviewed and are negative.   Physical Exam: Note: limited exam due to virtual visit There were no vitals taken for this visit. Physical Exam Nursing note reviewed.  Constitutional:      General: He is not in acute distress.    Appearance: Normal appearance. He is not ill-appearing, toxic-appearing or diaphoretic.  HENT:     Head: Normocephalic and atraumatic.     Nose: No congestion or rhinorrhea.  Eyes:     General: No scleral icterus. Pulmonary:     Effort: Pulmonary effort is normal.  Abdominal:     General: There is no distension.     Palpations: There is no hepatomegaly or splenomegaly.  Skin:    Coloration: Skin is not jaundiced.     Findings: No bruising.  Neurological:     General: No focal deficit present.     Mental Status: He is alert and oriented to person, place, and time. Mental status is at baseline.  Psychiatric:        Mood and Affect: Mood normal.        Behavior: Behavior normal.        Thought Content: Thought content normal.

## 2019-07-29 ENCOUNTER — Telehealth: Payer: Self-pay | Admitting: *Deleted

## 2019-07-29 ENCOUNTER — Other Ambulatory Visit: Payer: Self-pay | Admitting: *Deleted

## 2019-07-29 DIAGNOSIS — R112 Nausea with vomiting, unspecified: Secondary | ICD-10-CM

## 2019-07-29 NOTE — Telephone Encounter (Signed)
Called pt verified name and dob Pt stated insurance will not cover medication Called pharmacy Medication needs PA pharmacy will fax over one. Once completed will notify pt Pt notified

## 2019-07-29 NOTE — Telephone Encounter (Signed)
Spouse called and states the insurance will not cover the amitiza.

## 2019-07-29 NOTE — Telephone Encounter (Signed)
GES scheduled for 5/27 at 8:00am, arrival 7:45am, npo midnight, no stomach medications after midnight  Called spoke with spouse and she is aware of appt details. Nothing further needed

## 2019-07-31 ENCOUNTER — Telehealth: Payer: Self-pay | Admitting: *Deleted

## 2019-07-31 ENCOUNTER — Other Ambulatory Visit: Payer: Self-pay | Admitting: Nurse Practitioner

## 2019-07-31 DIAGNOSIS — R112 Nausea with vomiting, unspecified: Secondary | ICD-10-CM

## 2019-07-31 DIAGNOSIS — K219 Gastro-esophageal reflux disease without esophagitis: Secondary | ICD-10-CM

## 2019-07-31 NOTE — Telephone Encounter (Signed)
Jack Mcbride, does anything need to be done about this prescription?  From what I can tell, it looks like you are working on a [place] for [employer].

## 2019-07-31 NOTE — Telephone Encounter (Signed)
Pt consented to virtual visit on 07/25/19. 

## 2019-07-31 NOTE — Telephone Encounter (Signed)
Jack Mcbride, you are scheduled for a virtual visit with your provider today.  Just as we do with appointments in the office, we must obtain your consent to participate.  Your consent will be active for this visit and any virtual visit you may have with one of our providers in the next 365 days.  If you have a MyChart account, I can also send a copy of this consent to you electronically.  All virtual visits are billed to your insurance company just like a traditional visit in the office.  As this is a virtual visit, video technology does not allow for your provider to perform a traditional examination.  This may limit your provider's ability to fully assess your condition.  If your provider identifies any concerns that need to be evaluated in person or the need to arrange testing such as labs, EKG, etc, we will make arrangements to do so.  Although advances in technology are sophisticated, we cannot ensure that it will always work on either your end or our end.  If the connection with a video visit is poor, we may have to switch to a telephone visit.  With either a video or telephone visit, we are not always able to ensure that we have a secure connection.   I need to obtain your verbal consent now.   Are you willing to proceed with your visit today?

## 2019-08-01 ENCOUNTER — Ambulatory Visit (HOSPITAL_COMMUNITY)
Admission: RE | Admit: 2019-08-01 | Discharge: 2019-08-01 | Disposition: A | Discharge status: Home / Self Care | Payer: Medicare Other | Source: Ambulatory Visit | Attending: Nurse Practitioner | Admitting: Nurse Practitioner

## 2019-08-01 ENCOUNTER — Other Ambulatory Visit: Payer: Self-pay

## 2019-08-01 ENCOUNTER — Encounter (HOSPITAL_COMMUNITY): Payer: Self-pay

## 2019-08-01 ENCOUNTER — Telehealth: Payer: Self-pay | Admitting: Emergency Medicine

## 2019-08-01 DIAGNOSIS — R112 Nausea with vomiting, unspecified: Secondary | ICD-10-CM | POA: Insufficient documentation

## 2019-08-01 HISTORY — DX: Essential (primary) hypertension: I10

## 2019-08-01 MED ORDER — TECHNETIUM TC 99M SULFUR COLLOID
2.0000 | Freq: Once | INTRAVENOUS | Status: AC | PRN
  Administered 2019-08-01: 2 via ORAL

## 2019-08-01 NOTE — Telephone Encounter (Signed)
Noting. Routine to Walden Field, NP who last saw patient as an FYI. Magda Paganini, not sure if anything else needs to be done about this but will defer to [facility].

## 2019-08-01 NOTE — Telephone Encounter (Signed)
Does he need Amitiza sent in?

## 2019-08-01 NOTE — Telephone Encounter (Signed)
Pa started  Key: Nanine Means - PA Case ID: [id number] Waiting on response

## 2019-08-01 NOTE — Telephone Encounter (Signed)
No PA was needed for amatizia 8 mcg at this time per cover my meds

## 2019-08-01 NOTE — Telephone Encounter (Addendum)
Pa started Key: Nanine Means - PA Case ID: [id number] Waiting on a response. amatizia does not need PA per cover my meds pt and pharmacy notified

## 2019-08-02 NOTE — Telephone Encounter (Signed)
The medication has been filled by his pharmacy [facility] [place] and pt was notified

## 2019-08-07 NOTE — Telephone Encounter (Signed)
Noted. Let me know if any issues obtaining the medication.

## 2019-08-13 ENCOUNTER — Telehealth: Payer: Self-pay | Admitting: *Deleted

## 2019-08-13 NOTE — Telephone Encounter (Signed)
Noted  

## 2019-08-13 NOTE — Telephone Encounter (Signed)
Pt's wife called in for pt requesting GES results.  Informed her that we would be back in touch once reviewed by provider.  She voiced understanding.   [phone]

## 2019-08-19 NOTE — Telephone Encounter (Signed)
Pharmacy filled amitiza 9mcg

## 2019-08-26 ENCOUNTER — Other Ambulatory Visit: Payer: Self-pay | Admitting: Gastroenterology

## 2019-08-26 DIAGNOSIS — K5900 Constipation, unspecified: Secondary | ICD-10-CM

## 2019-09-03 ENCOUNTER — Other Ambulatory Visit: Payer: Self-pay

## 2019-09-03 ENCOUNTER — Ambulatory Visit (INDEPENDENT_AMBULATORY_CARE_PROVIDER_SITE_OTHER): Payer: Medicare Other | Admitting: Gastroenterology

## 2019-09-03 ENCOUNTER — Other Ambulatory Visit: Payer: Self-pay | Admitting: Gastroenterology

## 2019-09-03 ENCOUNTER — Encounter: Payer: Self-pay | Admitting: Gastroenterology

## 2019-09-03 VITALS — BP 121/73 | HR 86 | Temp 97.7°F | Ht 69.0 in | Wt 175.2 lb

## 2019-09-03 DIAGNOSIS — I70213 Atherosclerosis of native arteries of extremities with intermittent claudication, bilateral legs: Secondary | ICD-10-CM | POA: Diagnosis not present

## 2019-09-03 DIAGNOSIS — K219 Gastro-esophageal reflux disease without esophagitis: Secondary | ICD-10-CM

## 2019-09-03 DIAGNOSIS — K5900 Constipation, unspecified: Secondary | ICD-10-CM

## 2019-09-03 DIAGNOSIS — K3184 Gastroparesis: Secondary | ICD-10-CM | POA: Diagnosis not present

## 2019-09-03 MED ORDER — LUBIPROSTONE 24 MCG PO CAPS: 24.0000 ug | ORAL_CAPSULE | Freq: Two times a day (BID) | ORAL | 3 refills | Status: DC

## 2019-09-03 NOTE — Patient Instructions (Addendum)
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 Continue with strict management of your blood sugars as you are doing.  Continue Protonix twice a day.  For constipation, I have increased Amitiza. As it's a higher dose, let's take one gelcap with food daily and increase to twice a day if tolerated. If you need a little extra help, you can take Miralax daily in addition to this as needed.  We will see you in 4 months or sooner if needed.  Happy early Jack Mcbride!!!   It was a pleasure to see you today. I want to create trusting relationships with patients to provide genuine, compassionate, and quality care. I value your feedback. If you receive a survey regarding your visit,  I greatly appreciate you taking time to fill this out.   Annitta Needs, PhD, ANP-BC [facility]    Gastroparesis  Gastroparesis is a condition in which food takes longer than normal to empty from the stomach. The condition is usually long-lasting (chronic). It may also be called delayed gastric emptying. There is no cure, but there are treatments and things that you can do at home to help relieve symptoms. Treating the underlying condition that causes gastroparesis can also help relieve symptoms. What are the causes? In many cases, the cause of this condition is not known. Possible causes include:  A hormone (endocrine) disorder, such as hypothyroidism or diabetes.  A nervous system disease, such as Parkinson's disease or multiple sclerosis.  Cancer, infection, or surgery that affects the stomach or vagus nerve. The vagus nerve runs from your chest, through your neck, to the lower part of your brain.  A connective tissue disorder, such as scleroderma.  Certain medicines. What increases the risk? You are more likely to develop this condition if you:  Have certain disorders or diseases, including: ? An endocrine disorder. ? An eating  disorder. ? Amyloidosis. ? Scleroderma. ? Parkinson's disease. ? Multiple sclerosis. ? Cancer or infection of the stomach or the vagus nerve.  Have had surgery on the stomach or vagus nerve.  Take certain medicines.  Are male. What are the signs or symptoms? Symptoms of this condition include:  Feeling full after eating very little.  Nausea.  Vomiting.  Heartburn.  Abdominal bloating.  Inconsistent blood sugar (glucose) levels on blood tests.  Lack of appetite.  Weight loss.  Acid from the stomach coming up into the esophagus (gastroesophageal reflux).  Sudden tightening (spasm) of the stomach, which can be painful. Symptoms may come and go. Some people may not notice any symptoms. How is this diagnosed? This condition is diagnosed with tests, such as:  Tests that check how long it takes food to move through the stomach and intestines. These tests include: ? Upper gastrointestinal (GI) series. For this test, you drink a liquid that shows up well on X-rays, and then X-rays will be taken of your intestines. ? Gastric emptying scintigraphy. For this test, you eat food that contains a small amount of radioactive material, and then scans are taken. ? Wireless capsule GI monitoring system. For this test, you swallow a pill (capsule) that records information about how foods and fluid move through your stomach.  Gastric manometry. For this test, a tube is passed down your throat and into your stomach to measure electrical and muscular activity.  Endoscopy. For this test, a long, thin tube is passed down your throat and into your stomach to check for problems in your stomach lining.  Ultrasound. This  test uses sound waves to create images of inside the body. This can help rule out gallbladder disease or pancreatitis as a cause of your symptoms. How is this treated? There is no cure for gastroparesis. Treatment may include:  Treating the underlying cause.  Managing your  symptoms by making changes to your diet and exercise habits.  Taking medicines to control nausea and vomiting and to stimulate stomach muscles.  Getting food through a feeding tube in the hospital. This may be done in severe cases.  Having surgery to insert a device into your body that helps improve stomach emptying and control nausea and vomiting (gastric neurostimulator). Follow these instructions at home:  Take over-the-counter and prescription medicines only as told by your health care provider.  Follow instructions from your health care provider about eating or drinking restrictions. Your health care provider may recommend that you: ? Eat smaller meals more often. ? Eat low-fat foods. ? Eat low-fiber forms of high-fiber foods. For example, eat cooked vegetables instead of raw vegetables. ? Have only liquid foods instead of solid foods. Liquid foods are easier to digest.  Drink enough fluid to keep your urine pale yellow.  Exercise as often as told by your health care provider.  Keep all follow-up visits as told by your health care provider. This is important. Contact a health care provider if you:  Notice that your symptoms do not improve with treatment.  Have new symptoms. Get help right away if you:  Have severe abdominal pain that does not improve with treatment.  Have nausea that is severe or does not go away.  Cannot drink fluids without vomiting. Summary  Gastroparesis is a chronic condition in which food takes longer than normal to empty from the stomach.  Symptoms include nausea, vomiting, heartburn, abdominal bloating, and loss of appetite.  Eating smaller portions, and low-fat, low-fiber foods may help you manage your symptoms.  Get help right away if you have severe abdominal pain. This information is not intended to replace advice given to you by your health care provider. Make sure you discuss any questions you have with your health care  provider. Document Revised: 05/22/2017 Document Reviewed: 12/27/2016 Elsevier Patient Education  2020 [employer].

## 2019-09-03 NOTE — Progress Notes (Signed)
Referring Provider: Sherrilee Gilles, DO Primary Care Physician:  Sherrilee Gilles, DO  Primary GI: Dr. Gala Romney   Chief Complaint  Patient presents with  . Constipation  . vomiting    HPI:   Jack Mcbride is a 69 y.o. male presenting today with a history of polyps and surveillance slated for 2025, GERD, intermittent N/V, constipation. 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 No evidence of chronic mesenteric ischemia. 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 On the left, this results in a focal moderate high-grade stenosis. Midline ventral abdominal hernia containing a single loop of small bowel without evidence of wall thickening, obstruction or inflammation. Prostatomegaly with irregular central enhancement.  Chronic constipation: had done well with Amitzia 8 mcg BID in the past. Taking after he eats. BM every 3 days. Some straining.   GERD: Protonix BID. No GERD flares.   Nausea: minimally delayed gastric emptying. History of diabetes. Threw up yesterday. Vomited before breakfast. Mucus-like emesis. No hematemesis. No abdominal pain. Taking Zofran BID. Taking for morning and supper. Feels like symptoms are a little better.   Appetite is fair. No weight loss. Last year in the mid 150s. Now 175.   Past Medical History:  Diagnosis Date  . Benign prostate hyperplasia   . Diabetes ([place])   . Elevated PSA   . GERD (gastroesophageal reflux disease)   . History of kidney stones   . Hypercholesterolemia   . Hypertension   . Impotence of organic origin   . Kidney stone   . PAD (peripheral artery disease) ([place])   . Peripheral artery disease ([place])    s/p stenting  . Skin cancer    on nose  . Stroke (cerebrum) ([place])   . Stroke [facility])    2013 and 2017; 2019  . Urolithiasis     Past Surgical History:  Procedure Laterality Date  . BACK  SURGERY  05/2016  . BIOPSY  12/28/2017   Procedure: BIOPSY;  Surgeon: Daneil Dolin, MD;  [facility];  Service: Endoscopy;;  gastric bx's  . COLONOSCOPY WITH PROPOFOL N/A 11/20/2017   Dr. Gala Romney: Diverticulosis,two 5 to 30 mm polyps in the cecum and rectum.  Cecal polypectomy site tattooed.  Pathology revealed tubular adenoma, no high-grade dysplasia.  Recommend colonoscopy in 6 months.  . COLONOSCOPY WITH PROPOFOL N/A 09/03/2018   Procedure: COLONOSCOPY WITH PROPOFOL;  Surgeon: Daneil Dolin, MD;  [facility];  Service: Endoscopy;  Laterality: N/A;  10:00am  . ESOPHAGOGASTRODUODENOSCOPY (EGD) WITH PROPOFOL N/A 12/28/2017   Dr. Gala Romney: Normal esophagus, erosive gastropathy, duodenal erosions without bleeding.  Gastric biopsy benign.  No H. pylori on biopsy.  Marland Kitchen HERNIA REPAIR     umbilical  . LOOP RECORDER INSERTION  06/27/2017  . PERCUTANEOUS STENT INTERVENTION Left    Left leg for PAD  . POLYPECTOMY  11/20/2017   Procedure: POLYPECTOMY;  Surgeon: Daneil Dolin, MD;  [facility];  Service: Endoscopy;;  cecal  . POLYPECTOMY  09/03/2018   Procedure: POLYPECTOMY;  Surgeon: Daneil Dolin, MD;  [facility];  Service: Endoscopy;;  colon  . SHOULDER SURGERY Right    torn rotator cuff    Current Outpatient Medications  Medication Sig Dispense Refill  . amLODipine-benazepril (LOTREL) 5-20 MG capsule Take 1 capsule by mouth daily.    Marland Kitchen apixaban (ELIQUIS) 5 MG TABS tablet Take  5 mg by mouth 2 (two) times daily.    Marland Kitchen aspirin 81 MG tablet Take 81 mg by mouth daily.     Marland Kitchen atorvastatin (LIPITOR) 80 MG tablet Take 80 mg by mouth 3 (three) times a week. Taking 3 times weekly    . ciclopirox (PENLAC) 8 % solution ciclopirox 8 % topical solution  APPLY TOPICALLY TO AFFECTED AREA EVERY DAY    . finasteride (PROSCAR) 5 MG tablet Take 5 mg by mouth at bedtime.  3  . furosemide (LASIX) 20 MG tablet Take 20 mg by mouth daily as needed for edema. Pt can take 1  tab for 3 days as needed for ankle swelling     . LEVEMIR FLEXTOUCH 100 UNIT/ML Pen Inject 16 Units into the skin at bedtime.   1  . lubiprostone (AMITIZA) 8 MCG capsule Take 1 capsule (8 mcg total) by mouth 2 (two) times daily with a meal. 60 capsule 3  . metoprolol succinate (TOPROL-XL) 25 MG 24 hr tablet Take 25 mg by mouth daily.    . ondansetron (ZOFRAN) 4 MG tablet Take 1 tablet (4 mg total) by mouth 2 (two) times daily. 60 tablet 3  . pantoprazole (PROTONIX) 40 MG tablet TAKE 1 TABLET BY MOUTH TWICE A DAY BEFORE MEAL 180 tablet 1  . sertraline (ZOLOFT) 50 MG tablet Take 50 mg by mouth daily.    . tamsulosin (FLOMAX) 0.4 MG CAPS capsule Take 0.4 mg by mouth at bedtime.  3  . traZODone (DESYREL) 50 MG tablet Take 50 mg by mouth at bedtime.   1  . vitamin B-12 (CYANOCOBALAMIN) 1000 MCG tablet Take 1,000 mcg by mouth daily.     No current facility-administered medications for this visit.    Allergies as of 09/03/2019 - Review Complete 09/03/2019  Allergen Reaction Noted  . Codeine Itching 09/05/2011    Family History  Problem Relation Age of Onset  . Heart attack Mother   . Diabetes Father   . Colon cancer Neg Hx   . Gastric cancer Neg Hx   . Esophageal cancer Neg Hx     Social History   Socioeconomic History  . Marital status: Married    Spouse name: Not on file  . Number of children: Not on file  . Years of education: Not on file  . Highest education level: Not on file  Occupational History  . Not on file  Tobacco Use  . Smoking status: Former Smoker    Packs/day: 0.25    Years: 48.00    Pack years: 12.00    Types: Cigarettes    Start date: 03/07/1969    Quit date: 04/03/2018    Years since quitting: 1.4  . Smokeless tobacco: Never Used  Vaping Use  . Vaping Use: Never used  Substance and Sexual Activity  . Alcohol use: No  . Drug use: No  . Sexual activity: Yes    Birth control/protection: None  Other Topics Concern  . Not on file  Social History Narrative    . Not on file   Social Determinants of Health   Financial Resource Strain:   . Difficulty of Paying Living Expenses:   Food Insecurity:   . Worried About Charity fundraiser in the Last Year:   . Arboriculturist in the Last Year:   Transportation Needs:   . Film/video editor (Medical):   Marland Kitchen Lack of Transportation (Non-Medical):   Physical Activity:   . Days of Exercise per  Week:   . Minutes of Exercise per Session:   Stress:   . Feeling of Stress :   Social Connections:   . Frequency of Communication with Friends and Family:   . Frequency of Social Gatherings with Friends and Family:   . Attends Religious Services:   . Active Member of Clubs or Organizations:   . Attends Archivist Meetings:   Marland Kitchen Marital Status:     Review of Systems: Gen: Denies fever, chills, anorexia. Denies fatigue, weakness, weight loss.  CV: Denies chest pain, palpitations, syncope, peripheral edema, and claudication. Resp: Denies dyspnea at rest, cough, wheezing, coughing up blood, and pleurisy. GI: see HPI Derm: Denies rash, itching, dry skin Psych: Denies depression, anxiety, memory loss, confusion. No homicidal or suicidal ideation.  Heme: Denies bruising, bleeding, and enlarged lymph nodes.  Physical Exam: BP 121/73   Pulse 86   Temp 97.7 F (36.5 C) (Oral)   Ht 5\' 9"  (1.753 m)   Wt 175 lb 3.2 oz (79.5 kg)   BMI 25.87 kg/m  General:   Alert and oriented. No distress noted. Pleasant and cooperative.  Head:  Normocephalic and atraumatic. Eyes:  Conjuctiva clear without scleral icterus. Mouth:  Mask in place Abdomen:  +BS, soft, non-tender and non-distended. No rebound or guarding. +ventral hernia Msk:  Symmetrical without gross deformities. Normal posture. Extremities:  Without edema. Neurologic:  Alert and  oriented x4 Psych:  Alert and cooperative. Normal mood and affect.  ASSESSMENT: Jack Mcbride is a 69 y.o. male presenting today in routine follow-up for history of  GERD, constipation, N/V and s/p GES recently with findings consistent with gastroparesis in setting of diabetes. Weight loss has resolved.  GERD well managed with Protonix BID. Continue with Zofran prn. Discussed dietary measures for management of delayed gastric emptying.   Constipation less than ideally managed. Will increase Amitiza to 24 mcg BID. May take Miralax in addition to this as needed.    PLAN:   Protonix BID Gastroparesis diet provided Increased Amitiza to 24 mcg BID Miralax in addition as needed Return in 4 months or sooner if needed   Annitta Needs, PhD, ANP-BC [facility]

## 2019-09-17 ENCOUNTER — Telehealth: Payer: Self-pay | Admitting: *Deleted

## 2019-09-17 NOTE — Telephone Encounter (Signed)
Increase Zofran to before meals and at bedtime, 4 times per day. Back off to clear liquids for now. Slowly advance diet once tolerating it. Please have him review the gastroparesis diet I gave them. We may have to trial Reglan if no contraindication but will maximize with Zofran first.

## 2019-09-17 NOTE — Telephone Encounter (Signed)
Called pt back.  Spoke with wife (listed on [place]).  Advised pt to increase Zofran to before meals and at bedtime, 4 times per day. Back off to clear liquids for now. Slowly advance diet once tolerating it. Wife stated that they did not receive gastroparesis diet.  Made her aware that we may have to trial Reglan if no contraindication but will maximize with Zofran first.  She voiced understanding and requested that I mail pt a gastroparesis diet since they live in [place].  Mailed accordingly.

## 2019-09-17 NOTE — Telephone Encounter (Signed)
Pt was seen on 09/03/19.  Wife called in and said that nausea seems to be getting worse.  Stated that he has been vomiting for 4 days straight sometimes a couple of times a day.  Pt has been eating and drinking.  He has been taking Zofran but it doesn't seem to be working.  He takes it twice a day.  No diarrhea and has been urinating good.

## 2019-10-02 ENCOUNTER — Telehealth: Payer: Self-pay | Admitting: Internal Medicine

## 2019-10-02 MED ORDER — METOCLOPRAMIDE HCL 5 MG PO TABS: 5.0000 mg | ORAL_TABLET | Freq: Two times a day (BID) | ORAL | 1 refills | Status: DC

## 2019-10-02 NOTE — Addendum Note (Signed)
Addended by: Annitta Needs on: 10/02/2019 01:12 PM   Modules accepted: Orders

## 2019-10-02 NOTE — Telephone Encounter (Signed)
I have sent in short course of Reglan. Take this twice a day before breakfast and lunch. May trial before breakfast and dinner if this works better. Monitor for involuntary movement/twitching, lip smacking, confusion, dizziness, drowsiness. Would need to stop immediately if this occurs. Call with progress report in 1 week.

## 2019-10-02 NOTE — Telephone Encounter (Signed)
Spoke with pts spouse. She is aware that Reglan was called in and will monitor for side effects. Pt will call back in 1 week with a progress report.

## 2019-10-02 NOTE — Telephone Encounter (Signed)
Spoke with pts spouse. Pt is currently taking Zofran before meals to help with nausea. Pt is still having some nausea while on Zofran. Pt is taking Pantoprazole as directed twice daily. Pt is headed out of town for 2 weeks and pts spouse is concerned. Pt went out of town a few months ago and started vomiting before they went out to eat. Pt is asking if there is another medication that can be used to help with nausea?

## 2019-10-02 NOTE — Telephone Encounter (Signed)
Pt's wife called to say they are leaving for the beach Saturday and patient is having problems with nausea/vomiting and wanted to know if the provider could recommend something. Please advise. [phone]

## 2019-10-10 NOTE — Telephone Encounter (Signed)
Is there anything that I need to do with this prescription?

## 2019-10-11 NOTE — Telephone Encounter (Signed)
No, its been completed.

## 2019-10-14 ENCOUNTER — Telehealth: Payer: Self-pay | Admitting: Internal Medicine

## 2019-10-14 NOTE — Telephone Encounter (Signed)
Noted. Spoke with spouse. She is going to have pt call about samples when they get back in town in  week. Pt is going to d/c Amitiza and follow Arletha Pili, NP recommendations.

## 2019-10-14 NOTE — Telephone Encounter (Signed)
Pt's wife called to say that they were at [place] prescription isn't helping him. She is asking about another prescription that comes from [place] and was wanting him to try that. Please advise. I transferred call the AM VM. [phone]

## 2019-10-14 NOTE — Telephone Encounter (Signed)
VM received. Pts spouse says the Reglan isn't helping with the Nausea. Pts friend is a pt here and told [t about a medication from [place] called Motilium (Bompormide). The is taking the Reglan to help with nausea 30 mins before he eats and it's not helping. Pt ate this morning and it was nauseated 30 mins after eating and is still nauseated.

## 2019-10-14 NOTE — Telephone Encounter (Signed)
Make sure he is taking Protonix BID.   Zofran before meals.  He is on Amitiza, and this has a class side effect of nausea. Let's have him take a break from [place] for right now. I don't know if he has tried Linzess, but let's trial 145 mcg once daily, 30 minutes before eating. He can come pick up samples.   I'm not able to prescribe what he is describing.   Avoid fatty, fried foods. Eat frequent, small meals, 5-6 times per day. We need to bump his appointment up sooner.

## 2019-10-22 ENCOUNTER — Telehealth: Payer: Self-pay | Admitting: Internal Medicine

## 2019-10-22 ENCOUNTER — Telehealth: Payer: Self-pay

## 2019-10-22 ENCOUNTER — Other Ambulatory Visit: Payer: Self-pay | Admitting: Gastroenterology

## 2019-10-22 DIAGNOSIS — K219 Gastro-esophageal reflux disease without esophagitis: Secondary | ICD-10-CM

## 2019-10-22 DIAGNOSIS — R112 Nausea with vomiting, unspecified: Secondary | ICD-10-CM

## 2019-10-22 MED ORDER — ONDANSETRON HCL 4 MG PO TABS: 4.0000 mg | ORAL_TABLET | Freq: Four times a day (QID) | ORAL | 3 refills | Status: DC | PRN

## 2019-10-22 NOTE — Telephone Encounter (Signed)
Refill request received from [facility] for Zofran 4 mg 4 times daily.

## 2019-10-22 NOTE — Telephone Encounter (Signed)
[name] called with an update of pts nausea. Pt returned from the beach a couple days ago. Pt is taking Reglan 30 mins before a meals, Zofran as needed and Pantoprazole bid. Pts spouse says there isn't a day that goes by that pt isn't nauseated. When asked is pt avoiding fatty greasy foods ect. Pt ate some bacon this morning and was sick. Pt lays back in the recliner after eating as well. Discussed sitting up for 2-3 hours after eating in recliner instead on leaning back. Also discussed avoiding foods with oil like bacon ect. Please advise about pts nausea daily.

## 2019-10-22 NOTE — Telephone Encounter (Signed)
Rx sent for Zofran 4 mg q6 hours as needed.

## 2019-10-22 NOTE — Telephone Encounter (Signed)
Patient wife was supposed to call when they were back from the back so you could speak with them and they can be reached at [phone]

## 2019-10-23 NOTE — Telephone Encounter (Signed)
Spoke with pts spouse. Pt will follow recommendations of avoid fatty, greasy foods. Pt will continue Zofran along with Reglan. Erline Levine pt is scheduled for 01/2020. Please put pt on a cancellation list.

## 2019-10-23 NOTE — Telephone Encounter (Signed)
Noted  

## 2019-10-23 NOTE — Telephone Encounter (Signed)
Agree with avoiding fatty, greasy foods. We need to get him back in for an appt for further evaluation. Take Zofran scheduled along with Reglan.

## 2019-10-24 NOTE — Telephone Encounter (Signed)
Patient added to cancellation list.

## 2019-10-24 NOTE — Telephone Encounter (Signed)
Noted  

## 2019-11-17 ENCOUNTER — Other Ambulatory Visit: Payer: Self-pay | Admitting: Nurse Practitioner

## 2019-11-17 DIAGNOSIS — K219 Gastro-esophageal reflux disease without esophagitis: Secondary | ICD-10-CM

## 2019-11-17 DIAGNOSIS — R112 Nausea with vomiting, unspecified: Secondary | ICD-10-CM

## 2020-01-15 ENCOUNTER — Ambulatory Visit: Payer: Medicare Other | Admitting: Gastroenterology

## 2020-01-31 ENCOUNTER — Other Ambulatory Visit: Payer: Self-pay | Admitting: Gastroenterology

## 2020-04-01 ENCOUNTER — Other Ambulatory Visit: Payer: Self-pay | Admitting: Gastroenterology

## 2020-04-01 DIAGNOSIS — K5900 Constipation, unspecified: Secondary | ICD-10-CM

## 2020-06-08 ENCOUNTER — Ambulatory Visit (INDEPENDENT_AMBULATORY_CARE_PROVIDER_SITE_OTHER): Payer: Medicare Other

## 2020-06-08 ENCOUNTER — Other Ambulatory Visit: Payer: Self-pay

## 2020-06-08 ENCOUNTER — Ambulatory Visit (INDEPENDENT_AMBULATORY_CARE_PROVIDER_SITE_OTHER): Payer: Medicare Other | Admitting: Vascular Surgery

## 2020-06-08 ENCOUNTER — Encounter (INDEPENDENT_AMBULATORY_CARE_PROVIDER_SITE_OTHER): Payer: Medicare Other

## 2020-06-08 ENCOUNTER — Encounter (INDEPENDENT_AMBULATORY_CARE_PROVIDER_SITE_OTHER): Payer: Self-pay | Admitting: Vascular Surgery

## 2020-06-08 ENCOUNTER — Encounter (INDEPENDENT_AMBULATORY_CARE_PROVIDER_SITE_OTHER): Payer: Self-pay

## 2020-06-08 VITALS — BP 118/71 | HR 87 | Ht 69.0 in | Wt 161.0 lb

## 2020-06-08 DIAGNOSIS — K219 Gastro-esophageal reflux disease without esophagitis: Secondary | ICD-10-CM

## 2020-06-08 DIAGNOSIS — I714 Abdominal aortic aneurysm, without rupture, unspecified: Secondary | ICD-10-CM | POA: Insufficient documentation

## 2020-06-08 DIAGNOSIS — I701 Atherosclerosis of renal artery: Secondary | ICD-10-CM

## 2020-06-08 DIAGNOSIS — I10 Essential (primary) hypertension: Secondary | ICD-10-CM | POA: Diagnosis not present

## 2020-06-08 DIAGNOSIS — I70213 Atherosclerosis of native arteries of extremities with intermittent claudication, bilateral legs: Secondary | ICD-10-CM

## 2020-06-08 DIAGNOSIS — E782 Mixed hyperlipidemia: Secondary | ICD-10-CM

## 2020-06-08 NOTE — Progress Notes (Signed)
MRN : [id number]  Jack Mcbride is a 70 y.o. (1951-01-05) male who presents with chief complaint of  Chief Complaint  Patient presents with  . Follow-up    1 yr U/S   .  History of Present Illness:  The patient is seen for evaluation of painful lower extremities, right greater than left as well as right renal artery stenosis.  Patient notes the leg pain is variable and worsewith standing right leg is worse than the left and it gets worse with activity. The pain is somewhat consistent day to day occurring on most days. The pain has been progressive over the past year.  He notes that it is somewhat better if he is pushing a shopping cart.  No open wounds or sores at this time.  The patient isalsoseen for evaluation ofrenal artery stenosis incidentally found on CT scan in the remote past. He does admit to difficult to control hypertension. The patient has a long history of hypertension which recently has become increasingly difficult to control utilizing medical therapy.   The patient does have family history of hypertension.   There is no prior documented abdominal bruit. The patient occasionally has flushing symptoms but denies palpitations. No episodes of syncope.There is no history of headache. There is no history of flash pulmonary edema.  The patient denies a history of renal disease.  The patient denies amaurosis fugax or recent TIA symptoms. There are no recent neurological changes noted. The patient denies claudication symptoms or rest pain symptoms. The patient denies history of DVT, PE or superficial thrombophlebitis. The patient denies recent episodes of angina or shortness of breath.  Renal artery duplex shows <30% right  And 40-59% left renalartery stenosis  This is unchanged from previous study  ABI's Rt=1.15  and Lt=1.09   Current Meds  Medication Sig  . amLODipine-benazepril (LOTREL) 5-20 MG capsule Take 1 capsule by mouth daily.  Marland Kitchen apixaban  (ELIQUIS) 5 MG TABS tablet Take 5 mg by mouth 2 (two) times daily.  Marland Kitchen aspirin 81 MG tablet Take 81 mg by mouth daily.   Marland Kitchen atorvastatin (LIPITOR) 80 MG tablet Take 80 mg by mouth 3 (three) times a week. Taking 3 times weekly  . ciclopirox (PENLAC) 8 % solution ciclopirox 8 % topical solution  APPLY TOPICALLY TO AFFECTED AREA EVERY DAY  . dronabinol (MARINOL) 2.5 MG capsule dronabinol 2.5 mg capsule  . escitalopram (LEXAPRO) 10 MG tablet escitalopram 10 mg tablet  . escitalopram (LEXAPRO) 20 MG tablet Take 1 tablet by mouth daily.  . finasteride (PROSCAR) 5 MG tablet Take 5 mg by mouth at bedtime.  . furosemide (LASIX) 20 MG tablet Take 20 mg by mouth daily as needed for edema. Pt can take 1 tab for 3 days as needed for ankle swelling  . Glucagon (BAQSIMI ONE PACK) 3 MG/DOSE POWD Baqsimi 3 mg/actuation nasal spray  Take 3 mg as needed by nasal route as needed.  Marland Kitchen glucagon 1 MG injection Glucagon Emergency Kit 1 mg solution for injection  . insulin aspart (NOVOLOG FLEXPEN) 100 UNIT/ML FlexPen Inject 16 Units into the skin in the morning, at noon, and at bedtime.  Marland Kitchen LEVEMIR FLEXTOUCH 100 UNIT/ML Pen Inject 20 Units into the skin at bedtime.   Marland Kitchen lubiprostone (AMITIZA) 24 MCG capsule Take 1 capsule (24 mcg total) by mouth 2 (two) times daily with a meal.  . lubiprostone (AMITIZA) 8 MCG capsule Take 1 capsule (8 mcg total) by mouth 2 (two) times daily with a  meal.  . metoCLOPramide (REGLAN) 5 MG tablet TAKE 1 TABLET BY MOUTH TWICE A DAY BEFORE A MEAL  . metoprolol succinate (TOPROL-XL) 25 MG 24 hr tablet Take 25 mg by mouth daily.  . ondansetron (ZOFRAN) 4 MG tablet TAKE 1 TABLET BY MOUTH TWICE A DAY  . ondansetron (ZOFRAN-ODT) 4 MG disintegrating tablet Take 4 mg by mouth every 4 (four) hours as needed.  . ondansetron (ZOFRAN-ODT) 4 MG disintegrating tablet ondansetron 4 mg disintegrating tablet  . pantoprazole (PROTONIX) 40 MG tablet TAKE 1 TABLET BY MOUTH TWICE A DAY BEFORE A MEAL  . promethazine  (PHENERGAN) 25 MG tablet promethazine 25 mg tablet  . sertraline (ZOLOFT) 50 MG tablet Take 50 mg by mouth daily.  . tamsulosin (FLOMAX) 0.4 MG CAPS capsule Take 0.4 mg by mouth at bedtime.  . traZODone (DESYREL) 50 MG tablet Take 50 mg by mouth at bedtime.   . vitamin B-12 (CYANOCOBALAMIN) 1000 MCG tablet Take 1,000 mcg by mouth daily.    Past Medical History:  Diagnosis Date  . Benign prostate hyperplasia   . Diabetes ([place])   . Elevated PSA   . GERD (gastroesophageal reflux disease)   . History of kidney stones   . Hypercholesterolemia   . Hypertension   . Impotence of organic origin   . Kidney stone   . PAD (peripheral artery disease) ([place])   . Peripheral artery disease ([place])    s/p stenting  . Skin cancer    on nose  . Stroke (cerebrum) ([place])   . Stroke [facility])    2013 and 2017; 2019  . Urolithiasis     Past Surgical History:  Procedure Laterality Date  . BACK SURGERY  05/2016  . BIOPSY  12/28/2017   Procedure: BIOPSY;  Surgeon: Daneil Dolin, MD;  [facility];  Service: Endoscopy;;  gastric bx's  . COLONOSCOPY WITH PROPOFOL N/A 11/20/2017   Dr. Gala Romney: Diverticulosis,two 5 to 30 mm polyps in the cecum and rectum.  Cecal polypectomy site tattooed.  Pathology revealed tubular adenoma, no high-grade dysplasia.  Recommend colonoscopy in 6 months.  . COLONOSCOPY WITH PROPOFOL N/A 09/03/2018   sigmoid and descending colon diverticulosis, two 2-5 mm polyps in ascending colon and cecum, s/p removal. Polypoid mucosa and tubular adenoma. 5 year surveillance.   . ESOPHAGOGASTRODUODENOSCOPY (EGD) WITH PROPOFOL N/A 12/28/2017   Dr. Gala Romney: Normal esophagus, erosive gastropathy, duodenal erosions without bleeding.  Gastric biopsy benign.  No H. pylori on biopsy.  Marland Kitchen HERNIA REPAIR     umbilical  . LOOP RECORDER INSERTION  06/27/2017  . PERCUTANEOUS STENT INTERVENTION Left    Left leg for PAD  . POLYPECTOMY  11/20/2017   Procedure: POLYPECTOMY;  Surgeon: Daneil Dolin, MD;   [facility];  Service: Endoscopy;;  cecal  . POLYPECTOMY  09/03/2018   Procedure: POLYPECTOMY;  Surgeon: Daneil Dolin, MD;  Location[facility];  Service: Endoscopy;;  colon  . SHOULDER SURGERY Right    torn rotator cuff    Social History Social History   Tobacco Use  . Smoking status: Former Smoker    Packs/day: 0.25    Years: 48.00    Pack years: 12.00    Types: Cigarettes    Start date: 03/07/1969    Quit date: 04/03/2018    Years since quitting: 2.1  . Smokeless tobacco: Never Used  Vaping Use  . Vaping Use: Never used  Substance Use Topics  . Alcohol use: No  . Drug use:  No    Family History Family History  Problem Relation Age of Onset  . Heart attack Mother   . Diabetes Father   . Colon cancer Neg Hx   . Gastric cancer Neg Hx   . Esophageal cancer Neg Hx     Allergies  Allergen Reactions  . Codeine Itching     REVIEW OF SYSTEMS (Negative unless checked)  Constitutional: [] Weight loss  [] Fever  [] Chills Cardiac: [] Chest pain   [] Chest pressure   [] Palpitations   [] Shortness of breath when laying flat   [] Shortness of breath with exertion. Vascular:  [x] Pain in legs with walking   [] Pain in legs at rest  [] History of DVT   [] Phlebitis   [] Swelling in legs   [] Varicose veins   [] Non-healing ulcers Pulmonary:   [] Uses home oxygen   [] Productive cough   [] Hemoptysis   [] Wheeze  [] COPD   [] Asthma Neurologic:  [] Dizziness   [] Seizures   [] History of stroke   [] History of TIA  [] Aphasia   [] Vissual changes   [] Weakness or numbness in arm   [] Weakness or numbness in leg Musculoskeletal:   [] Joint swelling   [x] Joint pain   [] Low back pain Hematologic:  [] Easy bruising  [] Easy bleeding   [] Hypercoagulable state   [] Anemic Gastrointestinal:  [] Diarrhea   [] Vomiting  [x] Gastroesophageal reflux/heartburn   [] Difficulty swallowing. Genitourinary:  [] Chronic kidney disease   [] Difficult urination  [] Frequent urination   [] Blood in urine Skin:  [] Rashes    [] Ulcers  Psychological:  [] History of anxiety   []  History of major depression.  Physical Examination  Vitals:   06/08/20 1028  BP: 118/71  Pulse: 87  Weight: 161 lb (73 kg)  Height: 5\' 9"  (1.753 m)   Body mass index is 23.78 kg/m. Gen: WD/WN, NAD Head: [place]/AT, No temporalis wasting.  Ear/Nose/Throat: Hearing grossly intact, nares w/o erythema or drainage Eyes: PER, EOMI, sclera nonicteric.  Neck: Supple, no large masses.   Pulmonary:  Good air movement, no audible wheezing bilaterally, no use of accessory muscles.  Cardiac: RRR, no JVD Vascular:   Vessel Right Left  Radial Palpable Palpable  Gastrointestinal: Non-distended. No guarding/no peritoneal signs.  Musculoskeletal: M/S 5/5 throughout.  No deformity or atrophy.  Neurologic: CN 2-12 intact. Symmetrical.  Speech is fluent. Motor exam as listed above. Psychiatric: Judgment intact, Mood & affect appropriate for pt's clinical situation. Dermatologic: No rashes or ulcers noted.  No changes consistent with cellulitis.   CBC Lab Results  Component Value Date   WBC 11.0 (H) 11/13/2017   HGB 13.3 11/13/2017   HCT 40.0 11/13/2017   MCV 92.6 11/13/2017   PLT 124 (L) 11/13/2017    BMET    Component Value Date/Time   NA 136 08/30/2018 1416   K 4.7 08/30/2018 1416   CL 101 08/30/2018 1416   CO2 25 08/30/2018 1416   GLUCOSE 350 (H) 08/30/2018 1416   BUN 22 08/30/2018 1416   CREATININE 1.20 08/30/2018 1416   CREATININE 1.11 01/12/2018 1012   CALCIUM 9.0 08/30/2018 1416   GFRNONAA >60 08/30/2018 1416   GFRAA >60 08/30/2018 1416   CrCl cannot be calculated (Patient's most recent lab result is older than the maximum 21 days allowed.).  COAG No results found for: INR, PROTIME  Radiology No results found.   Assessment/Plan 1. Right renal artery stenosis (HCC) Given patient's arterial disease optimal control of the patient's hypertension is important. BP is excellent today  The patient's vital signs and  noninvasive studies support the  renal artery stenosis is not significantly increased when compared to the previous study.  No invasive studies or intervention is indicated at this time.  The patient will continue the current antihypertensive medications, no changes at this time.  The primary medical service will continue aggressive antihypertensive therapy as per the AHA guidelines   2. AAA (abdominal aortic aneurysm) without rupture (HCC) No surgery or intervention at this time. The patient has an asymptomatic abdominal aortic aneurysm that is less than 4 cm in maximal diameter.  I have discussed the natural history of abdominal aortic aneurysm and the small risk of rupture for aneurysm less than 5 cm in size.  However, as these small aneurysms tend to enlarge over time, continued surveillance with ultrasound or CT scan is mandatory.  I have also discussed optimizing medical management with hypertension and lipid control and the importance of abstinence from tobacco.  The patient is also encouraged to exercise a minimum of 30 minutes 4 times a week.  Should the patient develop new onset abdominal or back pain or signs of peripheral embolization they are instructed to seek medical attention immediately and to alert the physician providing care that they have an aneurysm.  The patient voices their understanding. The patient will return in 12 months with an aortic duplex.  - VAS US AORTA/IVC/ILIACS; Future  3. Atherosclerosis of native artery of both lower extremities with intermittent claudication (HCC) Recommend:  I do not find evidence of life style limiting vascular disease. The patient specifically denies life style limitation.  Previous noninvasive studies including ABI's of the legs do not identify critical vascular problems.  The patient should continue walking and begin a more formal exercise program. The patient should continue his antiplatelet therapy and aggressive treatment of  the lipid abnormalities.  The patient should begin wearing graduated compression socks 15-20 mmHg strength to control her mild edema.  - VAS [place] ABI WITH/WO TBI; Future  4. Essential hypertension Continue antihypertensive medications as already ordered, these medications have been reviewed and there are no changes at this time.   5. Mixed hyperlipidemia Continue statin as ordered and reviewed, no changes at this time   6. Gastroesophageal reflux disease, unspecified whether esophagitis present Continue PPI as already ordered, this medication has been reviewed and there are no changes at this time.  Avoidence of caffeine and alcohol  Moderate elevation of the head of the bed     Hortencia Pilar, MD  06/08/2020 10:37 AM

## 2020-10-01 ENCOUNTER — Other Ambulatory Visit: Payer: Self-pay | Admitting: Nurse Practitioner

## 2020-10-01 DIAGNOSIS — K5900 Constipation, unspecified: Secondary | ICD-10-CM

## 2020-11-10 ENCOUNTER — Other Ambulatory Visit: Payer: Self-pay | Admitting: Gastroenterology

## 2020-11-10 DIAGNOSIS — R112 Nausea with vomiting, unspecified: Secondary | ICD-10-CM

## 2020-11-10 DIAGNOSIS — K219 Gastro-esophageal reflux disease without esophagitis: Secondary | ICD-10-CM

## 2021-01-15 ENCOUNTER — Other Ambulatory Visit: Payer: Self-pay | Admitting: Gastroenterology

## 2021-01-15 DIAGNOSIS — K5900 Constipation, unspecified: Secondary | ICD-10-CM

## 2021-01-18 ENCOUNTER — Encounter: Payer: Self-pay | Admitting: Internal Medicine

## 2021-01-18 NOTE — Telephone Encounter (Signed)
Needs ov for refills.

## 2021-06-07 ENCOUNTER — Ambulatory Visit (INDEPENDENT_AMBULATORY_CARE_PROVIDER_SITE_OTHER): Payer: Medicare Other | Admitting: Vascular Surgery

## 2021-06-07 ENCOUNTER — Ambulatory Visit (INDEPENDENT_AMBULATORY_CARE_PROVIDER_SITE_OTHER): Payer: Medicare Other

## 2021-06-07 DIAGNOSIS — I70213 Atherosclerosis of native arteries of extremities with intermittent claudication, bilateral legs: Secondary | ICD-10-CM | POA: Diagnosis not present

## 2021-06-07 DIAGNOSIS — I714 Abdominal aortic aneurysm, without rupture, unspecified: Secondary | ICD-10-CM | POA: Diagnosis not present

## 2021-06-16 ENCOUNTER — Encounter (INDEPENDENT_AMBULATORY_CARE_PROVIDER_SITE_OTHER): Payer: Self-pay | Admitting: *Deleted

## 2021-08-07 ENCOUNTER — Other Ambulatory Visit: Payer: Self-pay | Admitting: Gastroenterology

## 2021-08-07 DIAGNOSIS — K5900 Constipation, unspecified: Secondary | ICD-10-CM

## 2021-08-16 NOTE — Progress Notes (Unsigned)
GI Office Note    Referring Provider: Sherrilee Gilles, DO Primary Care Physician:  Sherrilee Gilles, DO Primary GI: Dr. Gala Romney  Date:  08/18/2021  ID:  Ward [name], DOB May 25, 1950, MRN [id number]   Chief Complaint   Chief Complaint  Patient presents with   Nausea    Nausea and vomiting      History of Present Illness  Jack Mcbride is a 71 y.o. male with a history of BPH, diabetes, GERD, HLD, HTN, PAD, stroke (2013, 2017, 2019), adrenal insufficiency, hypothyroidism, AAA, and history of polyps with surveillance needed in 2025, N/V s/p GES consistent with gastroparesis, and constipation presenting today for hospital follow-up.  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  Prostamegaly with irregular central enhancement.  Patient last seen in the office 09/03/2019.  He was doing well on Amitiza 8 mcg twice daily taking after he eats.  Was having a BM every 3 days with some straining.  GERD well-controlled on Protonix twice daily without flares.  Continuing to have nausea with minimally delayed gastric emptying likely due to diabetes, having occasional vomiting with mucus-like emesis.  Denies abdominal pain.  Taking Zofran twice daily.  Appetite fair, no weight loss.  Weight stable at 175.  He was advised on gastroparesis diet, Amitiza increased to 24 mcg twice daily and MiraLAX as needed.   Patient continued to have nausea with Zofran twice daily before meals, and was advised to begin Zofran 4 times daily, before meals and at bedtime.  He continued to have nausea and spouse requested something else to help  him as they were going out of town.  Patient was sent in for Reglan to take 30 minutes prior to breakfast and supper. Patient also advised to stop Amitiza and to trial Linzess as Amitiza has a classic side effect of nausea.  Patient was to have an appointment in November 2021 but canceled.  He was recently admitted to [facility] from 4/6 to 4/11 due to progressive weakness resulting in falls, strokelike symptoms, and fever.  He had recent falls resulting in left-sided rib fractures and falls with positive head strike with prior work-up with CT and x-rays of spine and chest.  The morning that he presented to the hospital he was found to be acutely more weak by his son and wife and a temperature of 100.9.  He also had 1 episode of nonbloody emesis.  In the ED he had worsening right lower extremity weakness from his baseline and was evaluated by neurology and admitted to oncology for further evaluation.  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  PT recommended SNF for rehab given his frequent falls, right lower extremity weakness, rib fractures.  His insulin was increased during hospitalization due to hyperglycemia.   He has endocrinology follow-up scheduled for 09/01/2021.  Today: History provided by patient's wife [name] and the patient. Gastroparesis - No N/V in the hospital. Feels like since leaving the SNF nausea has been worse. Has been able to eat and maintain weight especially with the steroids. Not currently taking metoclopramide, one of the physicians took him off  of it. Has Zofran to take at home as well as phenergan. Eating normal meals 3 times a day. No identified food triggers. Has home health nurse that comes to their house on a regular basis. Trying to get on a better eating schedule. Yesterday morning he woke up getting sick. Felt like chicken Caesar wrap from the night before may have  bothered him. Since being on steroids his meals have been larger.   Hospital took him off of metoprolol, switched insulins from novolog to humalog. Reports that in the nursing home he was not given insulin as they should have. Wife states that she took him on one night and the SNF doc wrote for him to get metformin. Bad experience with blood sugar control. Blood sugars were controlled in the hospital just not in the SNF.   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 Then subsequent adrenal issues. Found out around thanksgiving. He was having mobility issues. Oncologist in [place] decided no more [place] but got a second opinion with [facility], has minimal cancer cells now. He was to stay on Keytruda. Did better initially with [place] regarding the nausea and vomiting.   Has had 8 strokes in the past, will occasionally have some tremors in his right arm, started in his left, and some intermittent head movements side to side when sitting in his recliner.    Past Medical History:  Diagnosis Date   Benign prostate hyperplasia    Diabetes ([place])    Elevated PSA    GERD (gastroesophageal reflux disease)    History of kidney stones    Hypercholesterolemia    Hypertension    Impotence of organic origin    Kidney stone    PAD (peripheral artery disease) ([place])    Peripheral artery disease ([place])    s/p stenting   Skin cancer    on nose   Stroke (cerebrum) ([place])    Stroke ([place])    2013 and 2017; 2019   Urolithiasis     Past Surgical History:  Procedure Laterality Date   BACK SURGERY  05/2016   BIOPSY  12/28/2017   Procedure: BIOPSY;  Surgeon: Daneil Dolin, MD;  [facility];  Service: Endoscopy;;  gastric bx's   COLONOSCOPY WITH PROPOFOL N/A 11/20/2017   Dr. Gala Romney: Diverticulosis,two 5 to 30 mm polyps in the cecum and rectum.  Cecal polypectomy site tattooed.  Pathology revealed tubular adenoma, no high-grade dysplasia.  Recommend colonoscopy in 6 months.    COLONOSCOPY WITH PROPOFOL N/A 09/03/2018   sigmoid and descending colon diverticulosis, two 2-5 mm polyps in ascending colon and cecum, s/p removal. Polypoid mucosa and tubular adenoma. 5 year surveillance.    ESOPHAGOGASTRODUODENOSCOPY (EGD) WITH PROPOFOL N/A 12/28/2017   Dr. Gala Romney: Normal esophagus, erosive gastropathy, duodenal erosions without bleeding.  Gastric biopsy benign.  No H. pylori on biopsy.   HERNIA REPAIR     umbilical   LOOP RECORDER INSERTION  06/27/2017   PERCUTANEOUS STENT INTERVENTION Left    Left leg for PAD   POLYPECTOMY  11/20/2017   Procedure: POLYPECTOMY;  Surgeon: Daneil Dolin, MD;  [facility];  Service: Endoscopy;;  cecal   POLYPECTOMY  09/03/2018   Procedure: POLYPECTOMY;  Surgeon: Daneil Dolin, MD;  [facility];  Service: Endoscopy;;  colon   SHOULDER SURGERY Right    torn rotator cuff    Current Outpatient Medications  Medication Sig Dispense Refill   amLODipine (NORVASC) 5 MG tablet Take  5 mg by mouth daily.     apixaban (ELIQUIS) 5 MG TABS tablet Take 5 mg by mouth 2 (two) times daily.     aspirin 81 MG tablet Take 81 mg by mouth daily.      atorvastatin (LIPITOR) 80 MG tablet Take 80 mg by mouth 3 (three) times a week. Taking 3 times weekly     ciclopirox (PENLAC) 8 % solution ciclopirox 8 % topical solution  APPLY TOPICALLY TO AFFECTED AREA EVERY DAY     dronabinol (MARINOL) 2.5 MG capsule dronabinol 2.5 mg capsule     escitalopram (LEXAPRO) 10 MG tablet escitalopram 10 mg tablet     Glucagon (BAQSIMI ONE PACK) 3 MG/DOSE POWD Baqsimi 3 mg/actuation nasal spray  Take 3 mg as needed by nasal route as needed.     glucagon 1 MG injection Glucagon Emergency Kit 1 mg solution for injection     HUMALOG KWIKPEN 100 UNIT/ML KwikPen Inject into the skin.     LEVEMIR FLEXTOUCH 100 UNIT/ML Pen Inject 20 Units into the skin at bedtime.   1   lubiprostone (AMITIZA) 24 MCG capsule Take 1 capsule (24 mcg total) by mouth 2 (two) times  daily with a meal. 60 capsule 3   metoprolol succinate (TOPROL-XL) 25 MG 24 hr tablet Take 25 mg by mouth daily.     pantoprazole (PROTONIX) 40 MG tablet TAKE 1 TABLET BY MOUTH TWICE A DAY BEFORE A MEAL 180 tablet 0   promethazine (PHENERGAN) 25 MG tablet promethazine 25 mg tablet     sertraline (ZOLOFT) 50 MG tablet Take 50 mg by mouth daily.     tamsulosin (FLOMAX) 0.4 MG CAPS capsule Take 0.4 mg by mouth at bedtime.  3   traZODone (DESYREL) 50 MG tablet Take 50 mg by mouth at bedtime.   1   hydrocortisone (CORTEF) 10 MG tablet Take 10 mg by mouth every evening.     hydrocortisone (CORTEF) 20 MG tablet Take 20 mg by mouth every morning.     ondansetron (ZOFRAN-ODT) 4 MG disintegrating tablet Take 1 tablet (4 mg total) by mouth every 4 (four) hours as needed for refractory nausea / vomiting. 30 tablet 1   No current facility-administered medications for this visit.    Allergies as of 08/18/2021 - Review Complete 08/18/2021  Allergen Reaction Noted   Codeine Itching 09/05/2011    Family History  Problem Relation Age of Onset   Heart attack Mother    Diabetes Father    Colon cancer Neg Hx    Gastric cancer Neg Hx    Esophageal cancer Neg Hx     Social History   Socioeconomic History   Marital status: Married    Spouse name: Not on file   Number of children: Not on file   Years of education: Not on file   Highest education level: Not on file  Occupational History   Not on file  Tobacco Use   Smoking status: Former    Packs/day: 0.25    Years: 48.00    Total pack years: 12.00    Types: Cigarettes    Start date: 03/07/1969    Quit date: 04/03/2018    Years since quitting: 3.3   Smokeless tobacco: Never  Vaping Use   Vaping Use: Never used  Substance and Sexual Activity   Alcohol use: No   Drug use: No   Sexual activity: Yes    Birth control/protection: None  Other Topics Concern   Not on file  Social History Narrative   Not on file   Social Determinants of Health    Financial Resource Strain: Not on file  Food Insecurity: Not on file  Transportation Needs: Not on file  Physical Activity: Not on file  Stress: Not on file  Social Connections: Not on file     Review of Systems   Gen: Denies fever, chills, anorexia. Denies fatigue, weakness, weight loss.  CV: Denies chest pain, palpitations, syncope, peripheral edema, and claudication. Resp: Denies dyspnea at rest, cough, wheezing, coughing up blood, and pleurisy. GI: Denies vomiting blood, jaundice, and fecal incontinence.   Denies dysphagia or odynophagia. Derm: Denies rash, itching, dry skin Psych: Denies depression, anxiety, memory loss, confusion. No homicidal or suicidal ideation.  Heme: Denies bruising, bleeding, and enlarged lymph nodes.   Physical Exam   BP (!) 161/72   Pulse (!) 102   Temp 97.6 F (36.4 C) (Temporal)   Ht $R'5\' 9"'sq$  (1.753 m)   Wt 168 lb 3.2 oz (76.3 kg)   BMI 24.84 kg/m   General:   Alert and oriented. No distress noted. Pleasant and cooperative.  Head:  Normocephalic and atraumatic. Eyes:  Conjuctiva clear without scleral icterus. Mouth:  Oral mucosa pink and moist. Good dentition. No lesions. Lungs:  Clear to auscultation bilaterally.  Diminished breath sounds left lower, s/p resection Heart:  S1, S2 present without murmurs appreciated.  Abdomen:  +BS, soft, non-tender and non-distended. No rebound or guarding. No HSM or masses noted. Rectal: deferred Msk:  Symmetrical without gross deformities. Normal posture. Extremities:  Without edema. Neurologic:  Alert and  oriented x4 Psych:  Alert and cooperative. Normal mood and affect.   Assessment  THOMOS DOMINE is a 71 y.o. male with a history of BPH, diabetes, GERD, HLD, HTN, PAD, stroke (2013, 2017, 2019), adrenal insufficiency, hypothyroidism, metastatic lung adenocarcinoma s/p LLL resection in 2020 as well as chemotherapy, AAA, and history of polyps with surveillance needed in 2025, gastroparesis, and  constipation presenting today with recent increase in nausea with vomiting secondary to gastroparesis  Gastroparesis: Per patient's wife and the patient began having increasing nausea with vomiting since leaving the SNF.  Stated he was in the hospital in April around [place] time and then spent a few weeks in SNF for rehab.  She stated that while he was at the SNF his blood sugars were uncontrolled as they were not giving him his insulin correctly.  His insulin was adjusted while hospitalized as they continue to be in the 200s or greater than 250.  She stated that since her last seen in 2021 he has come off of chemotherapy and was switched to [facility] and his nausea had improved after this occurred.  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  She states that they are following with endocrinology and oncology regularly and they have a good regimen going currently.  She states that since being on the steroids his energy level is better and he has actually gained weight as he had previously dropped to around the 150 range for his weight while on chemotherapy.  Since coming home from the SNF and being on steroid therapy he is eating larger amounts of food now eating about 3 meals a day.  Nausea is primarily in the mornings but feels like it has been related to what he eats the night before, vomiting  ranges from bilious in nature to clear mucus.  His most recent episode of vomiting was yesterday morning that was more brown/bilious in nature and feels as though it was due to a chicken Caesar wrap that he had had the night before for dinner.  He has not been using any of his prescription antiemetics and he has not taken Reglan for quite some time as like his previous physicians had taken him off of it.  He likely is not a great candidate for Reglan therapy in the future given his  prior 8 strokes and limited mobility and baseline tremors.  I feel as though his gastroparesis has likely worsened given poor blood sugar control given recent initiation of steroid therapy due to adrenal insufficiency.  Discussed his prior GES, diagnosis of gastroparesis and diet and lifestyle modifications going forward.  Separate handout provided and instructed to begin eating smaller meals 4-6 times a day and using Zofran on a more regular basis as needed (primarily in the mornings prior to breakfast and in the evenings prior to dinner or prior to going to bed), also avoiding eating within 2 to 3 hours of going to bed..  His appetite is great and his weight has been stable and feels that we should manage conservatively for now.  PLAN   Zofran refilled, written to take every 4 hours as needed but advised his wife to begin taking twice daily as needed, allowing for doses to be given in between for breakthrough.  Would not use Phenergan or Compazine unless Zofran becomes unhelpful. Gastroparesis and lifestyle modifications handout provided Eat 4-6 smaller meals throughout the day. Follow-up in 4 months    Venetia Night, MSN, FNP-BC, AGACNP-BC [facility]

## 2021-08-17 IMAGING — NM NM GASTRIC EMPTYING
10 series · 10 of 10 positions shown · non-contrast
Comparison: None

CLINICAL DATA: Nausea and vomiting for 4-5 months, acid reflux,
constipation

EXAM:
NUCLEAR MEDICINE GASTRIC EMPTYING SCAN
TECHNIQUE: After oral ingestion of radiolabeled meal, sequential abdominal
images were obtained for 4 hours. Percentage of activity emptying
the stomach was calculated at 1 hour, 2 hour, 3 hour, and 4 hours.
RADIOPHARMACEUTICALS:  2 mCi 8c-66m sulfur colloid in standardized
meal

[Series 1: 0 min · 4.14mm/px · 1 of 1 slices shown (1 of 2)]
[im 1/1]
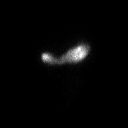

[Series 1: 0 min · 4.14mm/px · 1 of 1 slices shown (2 of 2)]
[im 1/1]
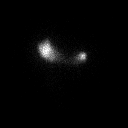

[Series 2: 60 min · 4.14mm/px · 1 of 1 slices shown (1 of 2)]
[im 1/1]
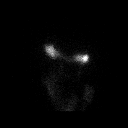

[Series 2: 60 min · 4.14mm/px · 1 of 1 slices shown (2 of 2)]
[im 1/1]
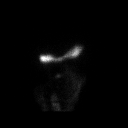

[Series 3: 120 min · 4.14mm/px · 1 of 1 slices shown (1 of 2)]
[im 1/1  full-range]
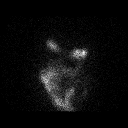

[Series 3: 120 min · 4.14mm/px · 1 of 1 slices shown (2 of 2)]
[im 1/1]
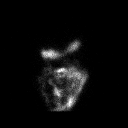

[Series 4: 180 min · 4.14mm/px · 1 of 1 slices shown (1 of 2)]
[im 1/1  full-range]
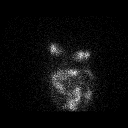

[Series 4: 180 min · 4.14mm/px · 1 of 1 slices shown (2 of 2)]
[im 1/1]
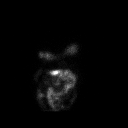

[Series 5: 240 min · 4.14mm/px · 1 of 1 slices shown (1 of 2)]
[im 1/1]
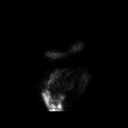

[Series 5: 240 min · 4.14mm/px · 1 of 1 slices shown (2 of 2)]
[im 1/1]
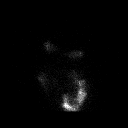

[10 of 10 positions shown; findings below may reference images not displayed]

FINDINGS: Expected location of the stomach in the left upper quadrant.

Ingested meal empties the stomach gradually over the course of the
study.

39% emptied at 1 hr ( normal >= 10%)

73% emptied at 2 hr ( normal >= 40%)

80% emptied at 3 hr ( normal >= 70%)

86% emptied at 4 hr ( normal >= 90%)
IMPRESSION: Minimally delayed gastric emptying.

## 2021-08-18 ENCOUNTER — Ambulatory Visit (INDEPENDENT_AMBULATORY_CARE_PROVIDER_SITE_OTHER): Payer: Medicare Other | Admitting: Gastroenterology

## 2021-08-18 ENCOUNTER — Encounter: Payer: Self-pay | Admitting: Gastroenterology

## 2021-08-18 VITALS — BP 161/72 | HR 102 | Temp 97.6°F | Ht 69.0 in | Wt 168.2 lb

## 2021-08-18 DIAGNOSIS — K3184 Gastroparesis: Secondary | ICD-10-CM

## 2021-08-18 DIAGNOSIS — R112 Nausea with vomiting, unspecified: Secondary | ICD-10-CM

## 2021-08-18 MED ORDER — ONDANSETRON 4 MG PO TBDP: 4.0000 mg | ORAL_TABLET | ORAL | 1 refills | Status: DC | PRN

## 2021-08-18 NOTE — Patient Instructions (Signed)
I have attached a handout regarding gastroparesis and things that you can do at home.  The best thing to do would to be adjust your diet eating smaller meals about 4-6 times a day rather than 3 larger meals.  Also has best to avoid within 2 to 3 hours of going to bed just that she would do if you had acid reflux.  This allows more time for food to digest prior to lying down for the night.  I have sent in a refill of her Zofran for you to take I would take as needed in the morning prior to eating breakfast and in the evening prior to going to bed to avoid this early morning symptoms.  For now we will continue to hold any prokinetic agents such as the Reglan and we will trial Zofran and dietary changes first.  I suspect that the worsening of the nausea recently is mostly due to worsening gastroparesis as there is been difficulty controlling blood sugars now that he is on steroids.  We will have you follow-up in 4 months or sooner if symptoms worsen in the meantime.  It was a pleasure to see you today. I want to create trusting relationships with patients. If you receive a survey regarding your visit,  I greatly appreciate you taking time to fill this out on paper or through your MyChart. I value your feedback.  Venetia Night, MSN, FNP-BC, AGACNP-BC [facility]

## 2021-09-07 ENCOUNTER — Other Ambulatory Visit: Payer: Self-pay | Admitting: Gastroenterology

## 2021-11-09 ENCOUNTER — Other Ambulatory Visit: Payer: Self-pay | Admitting: Gastroenterology

## 2021-11-09 DIAGNOSIS — K5900 Constipation, unspecified: Secondary | ICD-10-CM

## 2021-12-20 ENCOUNTER — Ambulatory Visit: Payer: Medicare Other | Admitting: Gastroenterology

## 2021-12-22 ENCOUNTER — Ambulatory Visit: Payer: Medicare Other | Admitting: Internal Medicine

## 2022-01-03 ENCOUNTER — Encounter (INDEPENDENT_AMBULATORY_CARE_PROVIDER_SITE_OTHER): Payer: Self-pay

## 2022-01-04 ENCOUNTER — Encounter: Payer: Self-pay | Admitting: Internal Medicine

## 2022-01-04 ENCOUNTER — Ambulatory Visit (INDEPENDENT_AMBULATORY_CARE_PROVIDER_SITE_OTHER): Payer: Medicare Other | Admitting: Internal Medicine

## 2022-01-04 VITALS — BP 146/80 | HR 96 | Temp 98.0°F | Ht 69.5 in | Wt 185.2 lb

## 2022-01-04 DIAGNOSIS — I70213 Atherosclerosis of native arteries of extremities with intermittent claudication, bilateral legs: Secondary | ICD-10-CM

## 2022-01-04 DIAGNOSIS — K3184 Gastroparesis: Secondary | ICD-10-CM | POA: Diagnosis not present

## 2022-01-04 DIAGNOSIS — K219 Gastro-esophageal reflux disease without esophagitis: Secondary | ICD-10-CM | POA: Diagnosis not present

## 2022-01-04 DIAGNOSIS — K529 Noninfective gastroenteritis and colitis, unspecified: Secondary | ICD-10-CM | POA: Diagnosis not present

## 2022-01-04 NOTE — Patient Instructions (Signed)
It was good to see you again today!  As discussed,  No change in your medical regimen at this time.  After reviewing the medical record and seeing you today, you most likely had ischemic colitis or less likely an infection causing your illness in [facility].  You have recovered.  I would like to see a current hemoglobin.  At this time, I would not rush to colonoscopy as yield would be low and may not be worth the risks right now.  However, you may get another colonoscopy at some point in the future  Please send me results of CBC to be obtained in [place] next week.  Plan to see Jack Mcbride back in the office in 3 months  Of course, if he has any interim problems please do not hesitate to call.

## 2022-01-04 NOTE — Progress Notes (Unsigned)
Primary Care Physician:  Sherrilee Gilles, DO Primary Gastroenterologist:  Dr. Gala Romney  Pre-Procedure History & Physical: HPI:  Jack Mcbride is a 71 y.o. male here for hospital follow-up.  History of stage IV lung cancer on Keytruda, secondary adrenal insufficiency.,  History of multiple embolic strokes peripheral arterial disease and diabetes with secondary gastroparesis here for follow-up.  2 months ago he became acutely ill while on vacation in [facility] developed left lower quadrant abdominal pain and rectal bleeding.  He was admitted to the hospital down there.  Medical records revealed left colon inflammation consistent with ischemic colitis.  Hemoglobin stayed to 10 range.  He was treated with Levaquin and metronidazole he recovered uneventfully no transfusion no colonoscopy.  He is doing well at this time.  Since he was last seen earlier in the year he has gained 17 pounds.  [facility] doctors took him off Reglan earlier this year.  He sees oncology in [place].  They tell him his lung cancer is in remission.  He is not having any nausea or vomiting he is taking a gastroparesis type diet.  Protonix 40 mg once daily OTC laxatives with good results.  As needed ondansetron.  Patient denies any active GI symptoms at this time.  Long history of colonic adenomas.  Last colonoscopy 2020 here demonstrated 2 small adenomas removed from the right side left-sided diverticulosis.  Tentatively due for repeat colonoscopy 2025.   Past Medical History:  Diagnosis Date   Benign prostate hyperplasia    Diabetes ([place])    Elevated PSA    GERD (gastroesophageal reflux disease)    History of kidney stones    Hypercholesterolemia    Hypertension    Impotence of organic origin    Kidney stone    PAD (peripheral artery disease) ([place])    Peripheral artery disease ([place])    s/p stenting   Skin cancer    on nose   Stroke (cerebrum) ([place])    Stroke ([place])    2013 and 2017; 2019   Urolithiasis     Past  Surgical History:  Procedure Laterality Date   BACK SURGERY  05/2016   BIOPSY  12/28/2017   Procedure: BIOPSY;  Surgeon: Daneil Dolin, MD;  [facility];  Service: Endoscopy;;  gastric bx's   COLONOSCOPY WITH PROPOFOL N/A 11/20/2017   Dr. Gala Romney: Diverticulosis,two 5 to 30 mm polyps in the cecum and rectum.  Cecal polypectomy site tattooed.  Pathology revealed tubular adenoma, no high-grade dysplasia.  Recommend colonoscopy in 6 months.   COLONOSCOPY WITH PROPOFOL N/A 09/03/2018   sigmoid and descending colon diverticulosis, two 2-5 mm polyps in ascending colon and cecum, s/p removal. Polypoid mucosa and tubular adenoma. 5 year surveillance.    ESOPHAGOGASTRODUODENOSCOPY (EGD) WITH PROPOFOL N/A 12/28/2017   Dr. Gala Romney: Normal esophagus, erosive gastropathy, duodenal erosions without bleeding.  Gastric biopsy benign.  No H. pylori on biopsy.   HERNIA REPAIR     umbilical   LOOP RECORDER INSERTION  06/27/2017   PERCUTANEOUS STENT INTERVENTION Left    Left leg for PAD   POLYPECTOMY  11/20/2017   Procedure: POLYPECTOMY;  Surgeon: Daneil Dolin, MD;  [facility];  Service: Endoscopy;;  cecal   POLYPECTOMY  09/03/2018   Procedure: POLYPECTOMY;  Surgeon: Daneil Dolin, MD;  [facility];  Service: Endoscopy;;  colon   SHOULDER SURGERY Right    torn rotator cuff    Prior to Admission medications   Medication  Sig Start Date End Date Taking? Authorizing Provider  amLODipine (NORVASC) 5 MG tablet Take 5 mg by mouth daily. 07/07/21  Yes [provider]  apixaban (ELIQUIS) 5 MG TABS tablet Take 5 mg by mouth 2 (two) times daily.   Yes [provider]  aspirin 81 MG tablet Take 81 mg by mouth daily.    Yes [provider]  atorvastatin (LIPITOR) 80 MG tablet Take 80 mg by mouth 3 (three) times a week. Taking 3 times weekly 10/13/15  Yes [provider]  escitalopram (LEXAPRO) 10 MG tablet Take 30 mg by mouth daily. 12/05/19  Yes  [provider]  HUMALOG KWIKPEN 100 UNIT/ML KwikPen Inject into the skin. 07/19/21  Yes [provider]  hydrocortisone (CORTEF) 10 MG tablet Take 10 mg by mouth every evening. 06/01/21  Yes [provider]  hydrocortisone (CORTEF) 20 MG tablet Take 20 mg by mouth every morning. 05/09/21  Yes [provider]  Insulin Glargine w/ Trans Port 100 UNIT/ML SOPN Inject into the skin. 09/13/21 09/13/22 Yes [provider]  ondansetron (ZOFRAN-ODT) 4 MG disintegrating tablet DISSOLVE 1 TABLET BY MOUTH EVERY 4 HOURS AS NEEDED FOR REFRACTORY NAUSEA AND VOMITING 09/08/21   Sherron Monday, NP  pantoprazole (PROTONIX) 40 MG tablet TAKE 1 TABLET BY MOUTH TWICE A DAY BEFORE A MEAL 11/10/21   Annitta Needs, NP  promethazine (PHENERGAN) 25 MG tablet promethazine 25 mg tablet 05/04/20   [provider]  sertraline (ZOLOFT) 50 MG tablet Take 50 mg by mouth daily.    [provider]  tamsulosin (FLOMAX) 0.4 MG CAPS capsule Take 0.4 mg by mouth at bedtime. 12/26/16   [provider]  traZODone (DESYREL) 50 MG tablet Take 50 mg by mouth at bedtime.  02/05/17   [provider]    Allergies as of 01/04/2022 - Review Complete 01/04/2022  Allergen Reaction Noted   Codeine Itching 09/05/2011    Family History  Problem Relation Age of Onset   Heart attack Mother    Diabetes Father    Colon cancer Neg Hx    Gastric cancer Neg Hx    Esophageal cancer Neg Hx     Social History   Socioeconomic History   Marital status: Married    Spouse name: Not on file   Number of children: Not on file   Years of education: Not on file   Highest education level: Not on file  Occupational History   Not on file  Tobacco Use   Smoking status: Former    Packs/day: 0.25    Years: 48.00    Total pack years: 12.00    Types: Cigarettes    Start date: 03/07/1969    Quit date: 04/03/2018    Years since quitting: 3.7   Smokeless tobacco: Never  Vaping Use    Vaping Use: Never used  Substance and Sexual Activity   Alcohol use: No   Drug use: No   Sexual activity: Yes    Birth control/protection: None  Other Topics Concern   Not on file  Social History Narrative   Not on file   Social Determinants of Health   Financial Resource Strain: Not on file  Food Insecurity: Not on file  Transportation Needs: Not on file  Physical Activity: Not on file  Stress: Not on file  Social Connections: Not on file  Intimate Partner Violence: Not on file    Review of Systems: See HPI, otherwise negative ROS  Physical Exam:  BP (!) 146/80 (BP Location: Right Arm, Patient Position: Sitting, Cuff Size: Normal)   Pulse 96   Temp 98 F (36.7 C) (Oral)   Ht 5' 9.5" (1.765 m)   Wt 185 lb 3.2 oz (84 kg)   SpO2 97%   BMI 26.96 kg/m  General:   Alert,  pleasant and cooperative in NAD.  Some speech difficulty which has gone with the lung cancer.  Accompanied by his spouse. Neck:  Supple; no masses or thyromegaly. No significant cervical adenopathy. Lungs:  Clear throughout to auscultation.   No wheezes, crackles, or rhonchi. No acute distress. Heart:  Regular rate and rhythm; no murmurs, clicks, rubs,  or gallops. Abdomen: Non-distended, normal bowel sounds.  Soft and nontender without appreciable mass or hepatosplenomegaly.  Pulses:  Normal pulses noted. Extremities:  Without clubbing or edema.  Impression/Plan: Pleasant but complicated 71 year old gent with a history of advanced lung cancer on Keytruda with secondary adrenal insufficiency, diabetes GERD hyperlipidemia hypertension embolic stroke peripheral arterial disease reportedly in remission.  GI issues including gastroparesis, constipation and GERD appear to be clinically well controlled at this time.  He is off Reglan which I wholeheartedly agree with at this time.  He is doing very well with acid suppression therapy as needed ondansetron and a gastroparesis diet.  OTC laxatives as needed managing  constipation very well.  Recent acute illness consistent with ischemic or segmental colitis.  I doubt infection.  It was a self-limiting illness from which she has totally recovered.  Would like to see an updated CBC  Would not rush to endoscopic evaluation unless his hemoglobin level is worrisome.  He does have a well chronicled history of colonic polyps and tentatively slated for repeat colonoscopy in 2025.  However, as his comorbidities crop up, the benefits of future colonoscopy become less clear.  Certainly, no need to urgently pursue such an invasive procedure at this time.  Recommendations:    Notice: This dictation was prepared with Dragon dictation along with smaller phrase technology. Any transcriptional errors that result from this process are unintentional and may not be corrected upon review.

## 2022-02-04 ENCOUNTER — Telehealth: Payer: Self-pay | Admitting: Internal Medicine

## 2022-02-04 NOTE — Telephone Encounter (Signed)
Received lab work done on February 01, 2022 at [facility] hematology/oncology Lenard Forth, CBC look good white count 9.9 H&H 12.2 36.8-13 ALT AST 12 and 11, respectively.  Total bilirubin 0.4  Please let patient know I reviewed labs they look good.  Plan per recent office visit.  Labs to be scanned into epic.

## 2022-02-04 NOTE — Telephone Encounter (Signed)
Pt's wife (DPR on file) was made aware and verbalized understanding.

## 2022-03-19 ENCOUNTER — Other Ambulatory Visit: Payer: Self-pay | Admitting: Gastroenterology

## 2022-03-29 ENCOUNTER — Ambulatory Visit (INDEPENDENT_AMBULATORY_CARE_PROVIDER_SITE_OTHER): Payer: Medicare Other | Admitting: Internal Medicine

## 2022-03-29 ENCOUNTER — Encounter: Payer: Self-pay | Admitting: Internal Medicine

## 2022-03-29 VITALS — BP 116/78 | HR 82 | Temp 98.0°F | Ht 70.0 in | Wt 192.0 lb

## 2022-03-29 DIAGNOSIS — K3184 Gastroparesis: Secondary | ICD-10-CM

## 2022-03-29 DIAGNOSIS — K219 Gastro-esophageal reflux disease without esophagitis: Secondary | ICD-10-CM

## 2022-03-29 DIAGNOSIS — D126 Benign neoplasm of colon, unspecified: Secondary | ICD-10-CM

## 2022-03-29 NOTE — Patient Instructions (Signed)
It was good to see you again today!  You may continue Protonix 40 mg twice daily before meals indefinitely.  This is a safe medication  Strive for maximal control of blood sugars.  May use ondansetron as needed for nausea  You have a good weight now; watch overall caloric intake  No need for repeat colonoscopy at this time  Office visit with [place] in 1 year and as needed.

## 2022-03-29 NOTE — Progress Notes (Unsigned)
Primary Care Physician:  Sherrilee Gilles, DO Primary Gastroenterologist:  Dr. Gala Romney  Pre-Procedure History & Physical: HPI:  Jack Mcbride is a 72 y.o. male here for follow-up of gastroparesis/nausea/GERD and constipation.  History of lung cancer in remission by patient report on Keytruda.  Adrenal insufficiency on hydrocortisone; managing constipation with diet.  Flare of nausea triggered escalation Protonix to 40 mg twice daily with good improvement.  Takes ondansetron as needed.  He is going to have surgery on his larynx next month by Dr. Patrice Paradise down at [facility]   Past Medical History:  Diagnosis Date   Benign prostate hyperplasia    Diabetes ([place])    Elevated PSA    GERD (gastroesophageal reflux disease)    History of kidney stones    Hypercholesterolemia    Hypertension    Impotence of organic origin    Kidney stone    PAD (peripheral artery disease) ([place])    Peripheral artery disease ([place])    s/p stenting   Skin cancer    on nose   Stroke (cerebrum) ([place])    Stroke ([place])    2013 and 2017; 2019   Urolithiasis     Past Surgical History:  Procedure Laterality Date   BACK SURGERY  05/2016   BIOPSY  12/28/2017   Procedure: BIOPSY;  Surgeon: Daneil Dolin, MD;  [facility];  Service: Endoscopy;;  gastric bx's   COLONOSCOPY WITH PROPOFOL N/A 11/20/2017   Dr. Gala Romney: Diverticulosis,two 5 to 30 mm polyps in the cecum and rectum.  Cecal polypectomy site tattooed.  Pathology revealed tubular adenoma, no high-grade dysplasia.  Recommend colonoscopy in 6 months.   COLONOSCOPY WITH PROPOFOL N/A 09/03/2018   sigmoid and descending colon diverticulosis, two 2-5 mm polyps in ascending colon and cecum, s/p removal. Polypoid mucosa and tubular adenoma. 5 year surveillance.    ESOPHAGOGASTRODUODENOSCOPY (EGD) WITH PROPOFOL N/A 12/28/2017   Dr. Gala Romney: Normal esophagus, erosive gastropathy, duodenal erosions without bleeding.  Gastric biopsy benign.  No H. pylori on biopsy.    HERNIA REPAIR     umbilical   LOOP RECORDER INSERTION  06/27/2017   PERCUTANEOUS STENT INTERVENTION Left    Left leg for PAD   POLYPECTOMY  11/20/2017   Procedure: POLYPECTOMY;  Surgeon: Daneil Dolin, MD;  [facility];  Service: Endoscopy;;  cecal   POLYPECTOMY  09/03/2018   Procedure: POLYPECTOMY;  Surgeon: Daneil Dolin, MD;  [facility];  Service: Endoscopy;;  colon   SHOULDER SURGERY Right    torn rotator cuff    Prior to Admission medications   Medication Sig Start Date End Date Taking? Authorizing Provider  amLODipine (NORVASC) 5 MG tablet Take 5 mg by mouth daily. 07/07/21   [provider]  apixaban (ELIQUIS) 5 MG TABS tablet Take 5 mg by mouth 2 (two) times daily.    [provider]  aspirin 81 MG tablet Take 81 mg by mouth daily.     [provider]  atorvastatin (LIPITOR) 80 MG tablet Take 80 mg by mouth 3 (three) times a week. Taking 3 times weekly 10/13/15   [provider]  Continuous Blood Gluc Receiver (FREESTYLE LIBRE 2 READER) DEVI USE TO TEST BLOOD GLUCOSE LEVELS ONCE A DAY E11.9 11/02/21   [provider]  Continuous Blood Gluc Sensor (FREESTYLE LIBRE 2 SENSOR) MISC TEST BLOOD GLUCOSE LEVEL ONCE A DAY E11.9 11/02/21   [provider]  escitalopram (LEXAPRO) 10 MG tablet Take 30  mg by mouth daily. 12/05/19   [provider]  finasteride (PROSCAR) 5 MG tablet Take 5 mg by mouth daily. 12/31/21   [provider]  HUMALOG KWIKPEN 100 UNIT/ML KwikPen Inject into the skin. 07/19/21   [provider]  HYDROcodone-acetaminophen (NORCO/VICODIN) 5-325 MG tablet TAKE 1 TO 2 TABLETS BY MOUTH 4 TIMES DAILY AS NEEDED    [provider]  hydrocortisone (CORTEF) 10 MG tablet Take 10 mg by mouth every evening. 06/01/21   [provider]  hydrocortisone (CORTEF) 20 MG tablet Take 20 mg by mouth every morning. 05/09/21   [provider]  Insulin Glargine w/ Trans  Port 100 UNIT/ML SOPN Inject into the skin. 09/13/21 09/13/22  [provider]  levothyroxine (SYNTHROID) 75 MCG tablet Take 75 mcg by mouth daily. 12/31/21   [provider]  ondansetron (ZOFRAN-ODT) 4 MG disintegrating tablet DISSOLVE 1 TAB IN MOUTH EVERY 4 HOURS AS NEEDED FOR NAUSEA AND VOMIT 03/22/22   Guida Asman, Cristopher Estimable, MD  pantoprazole (PROTONIX) 40 MG tablet TAKE 1 TABLET BY MOUTH TWICE A DAY BEFORE A MEAL Patient taking differently: Take 40 mg by mouth daily. TAKE 1 TABLET BY MOUTH BEFORE A MEAL 11/10/21   Annitta Needs, NP  promethazine (PHENERGAN) 25 MG tablet promethazine 25 mg tablet 05/04/20   [provider]  tamsulosin (FLOMAX) 0.4 MG CAPS capsule Take 0.4 mg by mouth at bedtime. 12/26/16   [provider]  traZODone (DESYREL) 50 MG tablet Take 50 mg by mouth at bedtime.  02/05/17   [provider]    Allergies as of 03/29/2022 - Review Complete 03/29/2022  Allergen Reaction Noted   Codeine Itching 09/05/2011    Family History  Problem Relation Age of Onset   Heart attack Mother    Diabetes Father    Colon cancer Neg Hx    Gastric cancer Neg Hx    Esophageal cancer Neg Hx     Social History   Socioeconomic History   Marital status: Married    Spouse name: Not on file   Number of children: Not on file   Years of education: Not on file   Highest education level: Not on file  Occupational History   Not on file  Tobacco Use   Smoking status: Former    Packs/day: 0.25    Years: 48.00    Total pack years: 12.00    Types: Cigarettes    Start date: 03/07/1969    Quit date: 04/03/2018    Years since quitting: 3.9   Smokeless tobacco: Never  Vaping Use   Vaping Use: Never used  Substance and Sexual Activity   Alcohol use: No   Drug use: No   Sexual activity: Yes    Birth control/protection: None  Other Topics Concern   Not on file  Social History Narrative   Not on file   Social Determinants of Health   Financial Resource  Strain: Not on file  Food Insecurity: Not on file  Transportation Needs: Not on file  Physical Activity: Not on file  Stress: Not on file  Social Connections: Not on file  Intimate Partner Violence: Not on file    Review of Systems: See HPI, otherwise negative ROS  Physical Exam: There were no vitals taken for this visit. General:   Alert,  Well-developed, well-nourished, pleasant and cooperative in NAD Skin:  Intact without significant lesions or rashes. Eyes:  Sclera clear, no icterus.   Conjunctiva pink. Ears:  Normal auditory  acuity. Nose:  No deformity, discharge,  or lesions. Mouth:  No deformity or lesions. Neck:  Supple; no masses or thyromegaly. No significant cervical adenopathy. Lungs:  Clear throughout to auscultation.   No wheezes, crackles, or rhonchi. No acute distress. Heart:  Regular rate and rhythm; no murmurs, clicks, rubs,  or gallops. Abdomen: Non-distended, normal bowel sounds.  Soft and nontender without appreciable mass or hepatosplenomegaly.  Pulses:  Normal pulses noted. Extremities:  Without clubbing or edema.  Impression/Plan: 73 year old gentleman with metastatic lung cancer secondary adrenal insufficiency history of multiple CVAs on chronic anticoagulation therapy For follow-up for nausea secondary to gastroparesis and GERD.  Nausea and GERD fairly well-controlled on twice daily Protonix.  Previously on Reglan but this was stopped.  Constipation not an issue at this time.  He has gained back much of his weight that he lost during his prior illnesses.  He is in a good place right now.  He needs to watch his caloric intake and strive for maximal glycemic control.  History of ischemic colitis [facility] last year with updated colonoscopy.  History of colonic polyps.  Recommendations:   continue Protonix 40 mg twice daily before meals indefinitely.  This is a safe medication  Strive for maximal control of blood sugars.  May use ondansetron as needed  for nausea  good weight now; watch overall caloric intake  No need for repeat colonoscopy at this time  Office visit  in 1 year and as needed.         Notice: This dictation was prepared with Dragon dictation along with smaller phrase technology. Any transcriptional errors that result from this process are unintentional and may not be corrected upon review.

## 2022-05-27 ENCOUNTER — Other Ambulatory Visit (INDEPENDENT_AMBULATORY_CARE_PROVIDER_SITE_OTHER): Payer: Self-pay | Admitting: Vascular Surgery

## 2022-05-27 DIAGNOSIS — I70213 Atherosclerosis of native arteries of extremities with intermittent claudication, bilateral legs: Secondary | ICD-10-CM

## 2022-05-27 DIAGNOSIS — I771 Stricture of artery: Secondary | ICD-10-CM

## 2022-06-06 ENCOUNTER — Encounter (INDEPENDENT_AMBULATORY_CARE_PROVIDER_SITE_OTHER): Payer: Medicare Other

## 2022-06-06 ENCOUNTER — Encounter (INDEPENDENT_AMBULATORY_CARE_PROVIDER_SITE_OTHER): Payer: Self-pay

## 2022-06-06 ENCOUNTER — Ambulatory Visit (INDEPENDENT_AMBULATORY_CARE_PROVIDER_SITE_OTHER): Payer: Medicare Other | Admitting: Vascular Surgery

## 2022-08-19 ENCOUNTER — Encounter: Payer: Self-pay | Admitting: Internal Medicine

## 2022-08-19 ENCOUNTER — Telehealth: Payer: Self-pay | Admitting: *Deleted

## 2022-08-19 ENCOUNTER — Ambulatory Visit (INDEPENDENT_AMBULATORY_CARE_PROVIDER_SITE_OTHER): Payer: Medicare Other | Admitting: Internal Medicine

## 2022-08-19 VITALS — BP 105/69 | HR 73 | Temp 98.1°F | Ht 70.0 in | Wt 192.2 lb

## 2022-08-19 DIAGNOSIS — D126 Benign neoplasm of colon, unspecified: Secondary | ICD-10-CM | POA: Diagnosis not present

## 2022-08-19 DIAGNOSIS — R112 Nausea with vomiting, unspecified: Secondary | ICD-10-CM | POA: Diagnosis not present

## 2022-08-19 DIAGNOSIS — K3184 Gastroparesis: Secondary | ICD-10-CM

## 2022-08-19 DIAGNOSIS — K219 Gastro-esophageal reflux disease without esophagitis: Secondary | ICD-10-CM

## 2022-08-19 NOTE — Telephone Encounter (Signed)
    08/19/22  Judeen Hammans 1950/06/16  What type of surgery is being performed? EGD  When is surgery scheduled? TBD  Medication clearance to hold Eliquis x 2 days and if he will need lovenox bridge? If so, can you assist in this?  Name of physician performing surgery?  Dr. Jaci Lazier [facility] at [facility] Phone: [phone] Fax: [phone]  Anethesia type (none, local, MAC, general)? MAC

## 2022-08-19 NOTE — Patient Instructions (Addendum)
It was good to see you again today!  I recommend a gastroparesis diet-information provided  5-6 smaller meals daily rather than 3 typical meals  Take Zofran up to 4 times a day-take 20 minutes before one of your small meals  Continue Protonix 40 mg twice daily-take in before meals  Maximum control of blood sugars will help stomach function better  No need for CT scan at this time  However, I do recommend an upper endoscopy to further evaluate nausea and vomiting.  ASA 3  We will hold Eliquis and bridge with Lovenox per prescriber recommendations  Blood pressure a little low today.  Would see primary care provider as soon as possible to reassess the need for 3 blood pressure medications.  Further recommendations to follow.

## 2022-08-19 NOTE — Progress Notes (Unsigned)
Primary Care Physician:  Jonathon Bellows, DO Primary Gastroenterologist:  Dr. Jena Gauss  Pre-Procedure History & Physical: HPI:  Jack Mcbride is a 72 y.o. male here for recurrent early satiety nausea and vomiting.  History of diabetic gastroparesis.  History of non-small cell lung cancer just came off of Keytruda several weeks ago.  35-month history of early satiety, nausea,  intermittent vomiting.  Symptoms well-controlled on twice daily Protonix.  Takes Zofran as needed for nausea.  Hx of Embolic strokes.  Takes Eliquis.  History of atrial fibrillation. Bowel function is good.  Denies rectal bleeding or melena.  Denies dysphagia.  Fundal gland polyp seen on EGD 2019 2020 multiple colonic adenomas on colonoscopy; due for surveillance 2025.  Since I saw him,  He underwent vocal cord surgery at [facility].  Was not given stress doses of corticosteroids and had adrenal crisis which put him in the hospital for 4 days.  He continues taking hydrocortisone.  Blood pressure running a little low today.  He is on 3 different antihypertensive agents. Past Medical History:  Diagnosis Date   Benign prostate hyperplasia    Diabetes (HCC)    Elevated PSA    GERD (gastroesophageal reflux disease)    History of kidney stones    Hypercholesterolemia    Hypertension    Impotence of organic origin    Kidney stone    PAD (peripheral artery disease) (HCC)    Peripheral artery disease (HCC)    s/p stenting   Skin cancer    on nose   Stroke (cerebrum) (HCC)    Stroke (HCC)    2013 and 2017; 2019   Urolithiasis     Past Surgical History:  Procedure Laterality Date   BACK SURGERY  05/2016   BIOPSY  12/28/2017   Procedure: BIOPSY;  Surgeon: Corbin Ade, MD;  Location: [facility];  Service: Endoscopy;;  gastric bx's   COLONOSCOPY WITH PROPOFOL N/A 11/20/2017   Dr. Jena Gauss: Diverticulosis,two 5 to 30 mm polyps in the cecum and rectum.  Cecal polypectomy site tattooed.  Pathology revealed tubular adenoma,  no high-grade dysplasia.  Recommend colonoscopy in 6 months.   COLONOSCOPY WITH PROPOFOL N/A 09/03/2018   sigmoid and descending colon diverticulosis, two 2-5 mm polyps in ascending colon and cecum, s/p removal. Polypoid mucosa and tubular adenoma. 5 year surveillance.    ESOPHAGOGASTRODUODENOSCOPY (EGD) WITH PROPOFOL N/A 12/28/2017   Dr. Jena Gauss: Normal esophagus, erosive gastropathy, duodenal erosions without bleeding.  Gastric biopsy benign.  No H. pylori on biopsy.   HERNIA REPAIR     umbilical   LOOP RECORDER INSERTION  06/27/2017   PERCUTANEOUS STENT INTERVENTION Left    Left leg for PAD   POLYPECTOMY  11/20/2017   Procedure: POLYPECTOMY;  Surgeon: Corbin Ade, MD;  Location: [facility];  Service: Endoscopy;;  cecal   POLYPECTOMY  09/03/2018   Procedure: POLYPECTOMY;  Surgeon: Corbin Ade, MD;  Location: [facility];  Service: Endoscopy;;  colon   SHOULDER SURGERY Right    torn rotator cuff    Prior to Admission medications   Medication Sig Start Date End Date Taking? Authorizing Provider  amLODipine (NORVASC) 10 MG tablet Take 10 mg by mouth daily.   Yes [provider]  apixaban (ELIQUIS) 5 MG TABS tablet Take 5 mg by mouth 2 (two) times daily.   Yes [provider]  aspirin 81 MG tablet Take 81 mg by mouth daily.    Yes [provider]  atorvastatin (LIPITOR) 80 MG tablet Take 80 mg by mouth 3 (three) times a week. Taking 3 times weekly 10/13/15  Yes [provider]  cetirizine (ZYRTEC) 10 MG tablet Take 10 mg by mouth daily.   Yes [provider]  Continuous Blood Gluc Receiver (FREESTYLE LIBRE 2 READER) DEVI USE TO TEST BLOOD GLUCOSE LEVELS ONCE A DAY E11.9 11/02/21  Yes [provider]  Continuous Blood Gluc Sensor (FREESTYLE LIBRE 2 SENSOR) MISC TEST BLOOD GLUCOSE LEVEL ONCE A DAY E11.9 11/02/21  Yes [provider]  escitalopram (LEXAPRO) 10 MG tablet Take 30 mg by mouth daily. 12/05/19  Yes [provider]  finasteride (PROSCAR) 5 MG tablet Take 5 mg by mouth daily. 12/31/21  Yes [provider]  fluticasone (FLONASE) 50 MCG/ACT nasal spray Place into the nose. 05/13/21  Yes [provider]  HUMALOG KWIKPEN 100 UNIT/ML KwikPen Inject into the skin. 07/19/21  Yes [provider]  hydrocortisone (CORTEF) 10 MG tablet Take 10 mg by mouth every evening. 06/01/21  Yes [provider]  hydrocortisone (CORTEF) 20 MG tablet Take 20 mg by mouth every morning. 05/09/21  Yes [provider]  Insulin Glargine w/ Trans Port 100 UNIT/ML SOPN Inject into the skin. 09/13/21 09/13/22 Yes [provider]  levothyroxine (SYNTHROID) 75 MCG tablet Take 75 mcg by mouth daily. 12/31/21  Yes [provider]  metoprolol succinate (TOPROL-XL) 25 MG 24 hr tablet Take by mouth. 03/16/22  Yes [provider]  ondansetron (ZOFRAN-ODT) 4 MG disintegrating tablet DISSOLVE 1 TAB IN MOUTH EVERY 4 HOURS AS NEEDED FOR NAUSEA AND VOMIT 03/22/22  Yes Brennan Litzinger, [name], MD  pantoprazole (PROTONIX) 40 MG tablet TAKE 1 TABLET BY MOUTH TWICE A DAY BEFORE A MEAL Patient taking differently: Take 40 mg by mouth daily. TAKE 1 TABLET BY MOUTH BEFORE A MEAL 11/10/21  Yes Gelene Mink, NP  tamsulosin (FLOMAX) 0.4 MG CAPS capsule Take 0.4 mg by mouth at bedtime. 12/26/16  Yes [provider]  traZODone (DESYREL) 50 MG tablet Take 50 mg by mouth at bedtime.  02/05/17  Yes [provider]    Allergies as of 08/19/2022 - Review Complete 08/19/2022  Allergen Reaction Noted   Codeine Itching 09/05/2011    Family History  Problem Relation Age of Onset   Heart attack Mother    Diabetes Father    Colon cancer Neg Hx    Gastric cancer Neg Hx    Esophageal cancer Neg Hx     Social History   Socioeconomic History   Marital status: Married    Spouse name: Not on file   Number of children: Not on file   Years of education: Not on file   Highest education  level: Not on file  Occupational History   Not on file  Tobacco Use   Smoking status: Former    Packs/day: 0.25    Years: 48.00    Additional pack years: 0.00    Total pack years: 12.00    Types: Cigarettes    Start date: 03/07/1969    Quit date: 04/03/2018    Years since quitting: 4.3   Smokeless tobacco: Never  Vaping Use   Vaping Use: Never used  Substance and Sexual Activity   Alcohol use: No   Drug use: No   Sexual activity: Yes    Birth control/protection: None  Other Topics Concern   Not on file  Social History Narrative   Not on file   Social  Determinants of Health   Financial Resource Strain: Not on file  Food Insecurity: Not on file  Transportation Needs: Not on file  Physical Activity: Not on file  Stress: Not on file  Social Connections: Not on file  Intimate Partner Violence: Not on file    Review of Systems: See HPI, otherwise negative ROS  Physical Exam: BP 105/69 (BP Location: Left Arm, Patient Position: Sitting, Cuff Size: Large)   Pulse 73   Temp 98.1 F (36.7 C) (Oral)   Ht 5\' 10"  (1.778 m)   Wt 192 lb 3.2 oz (87.2 kg)   SpO2 95%   BMI 27.58 kg/m  General:   Alert,   pleasant and cooperative in NAD.  Extremely flat affect.  Accompanied by his wife. Neck:  Supple; no masses or thyromegaly. No significant cervical adenopathy. Lungs:  Clear throughout to auscultation.   No wheezes, crackles, or rhonchi. No acute distress. Heart:  Regular rate and rhythm; no murmurs, clicks, rubs,  or gallops. Abdomen: Non-distended, normal bowel sounds.  Soft and nontender without appreciable mass or hepatosplenomegaly.   Impression/Plan: 72 year old gentleman history of non-small cell lung cancer  - just finished a course of Keytruda.  Reportedly cancer free at this time.  Recent embolic strokes on Eliquis.  39-month history of early satiety,  nausea vomiting. Adrenal insufficiency makes diabetes management challenging with daily hydrocortisone. Abdominal exam is  benign today.  No significant abdominal pain.  Blood pressure little low today.  He may be overmedicated with 3 antihypertensive agents.  Could be a contributing factor as well.  Colonic adenoma; due for surveillance 2025. I suspect nausea vomiting multifactorial in etiology but likely worsening of gastroparesis.  Recommendations:  I recommend a gastroparesis diet-information provided  5-6 smaller meals daily rather than 3 typical meals  Take Zofran up to 4 times a day-take 20 minutes before one of your small meals  Continue Protonix 40 mg twice daily-take in before meals  Maximum control of blood sugars will help stomach function better  No need for CT scan at this time  However, I do recommend an upper endoscopy to further evaluate nausea and vomiting.  ASA 3.  The risks, benefits, limitations, alternatives and imponderables have been reviewed with the patient. Potential for esophageal dilation, biopsy, etc. have also been reviewed.  Questions have been answered. All parties agreeable.   We will hold Eliquis and bridge with Lovenox per prescriber recommendations  Blood pressure a little low today.  Would see primary care provider as soon as possible to reassess the need for 3 blood pressure medications.  Avoid Reglan in this clinical setting.   Notice: This dictation was prepared with Dragon dictation along with smaller phrase technology. Any transcriptional errors that result from this process are unintentional and may not be corrected upon review.

## 2022-08-19 NOTE — Telephone Encounter (Signed)
I have faxed clearance to Dr. Edward Jolly at [facility] in [place], [place] at [phone]

## 2022-08-29 NOTE — Telephone Encounter (Signed)
Called Dr. Edward Jolly office at [phone] and they did receive fax. Will get a call or fax back

## 2022-08-30 NOTE — Telephone Encounter (Signed)
Spouse aware of pre-op appt details.

## 2022-08-30 NOTE — Telephone Encounter (Signed)
Received medication clearance and scanned in media. Okay to hold eliquis x 2 days. No lovenox bridge needed per fax.    Called pt and spouse with spouse. Dr. Jena Gauss had cancellation Friday and she wanted to go ahead have pt get procedure done. Discussed EGD instructions in detail with pt. Aware he needs to hold eliquis starting tomorrow. He will also take HALF dose lantus evening prior to procedure. Advised will call back with pre-op appt also. She voiced understanding of instructions also.

## 2022-08-30 NOTE — Patient Instructions (Signed)
20    Your procedure is scheduled on: 09/02/2022  Report to [facility] Main Entrance at  10:45   AM.  Call this number if you have problems the morning of surgery: [phone]   Remember:   Follow instructions on letter from office regarding when to stop eating and drinking        No Smoking the day of procedure Take onlt 1/2 dose of lantus 8 uints the night before procedure  No diabetic medications am of procedure      Take these medicines the morning of surgery with A SIP OF WATER: Zyrtec, lexapro, levothyroxine, metoprolol, pantoprazole and hydrocortisone   Do not wear jewelry, make-up or nail polish.  Do not wear lotions, powders, or perfumes. You may wear deodorant.                Do not bring valuables to the hospital.  Contacts, dentures or bridgework may not be worn into surgery.  Leave suitcase in the car. After surgery it may be brought to your room.  For patients admitted to the hospital, checkout time is 11:00 AM the day of discharge.   Patients discharged the day of surgery will not be allowed to drive home. Upper Endoscopy, Adult Upper endoscopy is a procedure to look inside the upper GI (gastrointestinal) tract. The upper GI tract is made up of: The part of the body that moves food from your mouth to your stomach (esophagus). The stomach. The first part of your small intestine (duodenum). This procedure is also called esophagogastroduodenoscopy (EGD) or gastroscopy. In this procedure, your health care provider passes a thin, flexible tube (endoscope) through your mouth and down your esophagus into your stomach. A small camera is attached to the end of the tube. Images from the camera appear on a monitor in the exam room. During this procedure, your health care provider may also remove a small piece of tissue to be sent to a lab and examined under a microscope (biopsy). Your health care provider may do an upper endoscopy to diagnose cancers of the upper GI tract. You may  also have this procedure to find the cause of other conditions, such as: Stomach pain. Heartburn. Pain or problems when swallowing. Nausea and vomiting. Stomach bleeding. Stomach ulcers. Tell a health care provider about: Any allergies you have. All medicines you are taking, including vitamins, herbs, eye drops, creams, and over-the-counter medicines. Any problems you or family members have had with anesthetic medicines. Any blood disorders you have. Any surgeries you have had. Any medical conditions you have. Whether you are pregnant or may be pregnant. What are the risks? Generally, this is a safe procedure. However, problems may occur, including: Infection. Bleeding. Allergic reactions to medicines. A tear or hole (perforation) in the esophagus, stomach, or duodenum. What happens before the procedure? Staying hydrated Follow instructions from your health care provider about hydration, which may include: Up to 4 hours before the procedure - you may continue to drink clear liquids, such as water, clear fruit juice, black coffee, and plain tea.   Medicines Ask your health care provider about: Changing or stopping your regular medicines. This is especially important if you are taking diabetes medicines or blood thinners. Taking medicines such as aspirin and ibuprofen. These medicines can thin your blood. Do not take these medicines unless your health care provider tells you to take them. Taking over-the-counter medicines, vitamins, herbs, and supplements. General instructions Plan to have someone take you home from the hospital  or clinic. If you will be going home right after the procedure, plan to have someone with you for 24 hours. Ask your health care provider what steps will be taken to help prevent infection. What happens during the procedure?  An IV will be inserted into one of your veins. You may be given one or more of the following: A medicine to help you relax  (sedative). A medicine to numb the throat (local anesthetic). You will lie on your left side on an exam table. Your health care provider will pass the endoscope through your mouth and down your esophagus. Your health care provider will use the scope to check the inside of your esophagus, stomach, and duodenum. Biopsies may be taken. The endoscope will be removed. The procedure may vary among health care providers and hospitals. What happens after the procedure? Your blood pressure, heart rate, breathing rate, and blood oxygen level will be monitored until you leave the hospital or clinic. Do not drive for 24 hours if you were given a sedative during your procedure. When your throat is no longer numb, you may be given some fluids to drink. It is up to you to get the results of your procedure. Ask your health care provider, or the department that is doing the procedure, when your results will be ready. Summary Upper endoscopy is a procedure to look inside the upper GI tract. During the procedure, an IV will be inserted into one of your veins. You may be given a medicine to help you relax. A medicine will be used to numb your throat. The endoscope will be passed through your mouth and down your esophagus. This information is not intended to replace advice given to you by your health care provider. Make sure you discuss any questions you have with your health care provider. Document Revised: 08/16/2017 Document Reviewed: 07/24/2017 Elsevier Patient Education  2020 [employer]                                                                                                                                      EndoscopyCare After  Please read the instructions outlined below and refer to this sheet in the next few weeks. These discharge instructions provide you with general information on caring for yourself after you leave the hospital. Your doctor may also give you specific instructions. While  your treatment has been planned according to the most current medical practices available, unavoidable complications occasionally occur. If you have any problems or questions after discharge, please call your doctor. HOME CARE INSTRUCTIONS Activity You may resume your regular activity but move at a slower pace for the next 24 hours.  Take frequent rest periods for the next 24 hours.  Walking will help expel (get rid of) the air and reduce the bloated feeling in your abdomen.  No driving for 24 hours (because of the anesthesia (medicine) used during the test).  You may shower.  Do not sign any important legal documents or operate any machinery for 24 hours (because of the anesthesia used during the test).  Nutrition Drink plenty of fluids.  You may resume your normal diet.  Begin with a light meal and progress to your normal diet.  Avoid alcoholic beverages for 24 hours or as instructed by your caregiver.  Medications You may resume your normal medications unless your caregiver tells you otherwise. What you can expect today You may experience abdominal discomfort such as a feeling of fullness or "gas" pains.  You may experience a sore throat for 2 to 3 days. This is normal. Gargling with salt water may help this.  Follow-up Your doctor will discuss the results of your test with you. SEEK IMMEDIATE MEDICAL CARE IF: You have excessive nausea (feeling sick to your stomach) and/or vomiting.  You have severe abdominal pain and distention (swelling).  You have trouble swallowing.  You have a temperature over 100 F (37.8 C).  You have rectal bleeding or vomiting of blood.  Document Released: 10/06/2003 Document Revised: 02/10/2011 Document Reviewed: 04/18/2007

## 2022-08-31 ENCOUNTER — Ambulatory Visit: Payer: Medicare Other | Admitting: Gastroenterology

## 2022-09-01 ENCOUNTER — Encounter (HOSPITAL_COMMUNITY): Payer: Self-pay

## 2022-09-01 ENCOUNTER — Other Ambulatory Visit: Payer: Self-pay

## 2022-09-01 ENCOUNTER — Emergency Department (HOSPITAL_COMMUNITY): Payer: Medicare Other

## 2022-09-01 ENCOUNTER — Inpatient Hospital Stay (HOSPITAL_COMMUNITY)
Admission: RE | Admit: 2022-09-01 | Discharge: 2022-09-01 | Disposition: A | Discharge status: Home / Self Care | Payer: Medicare Other | Source: Ambulatory Visit

## 2022-09-01 ENCOUNTER — Encounter (HOSPITAL_COMMUNITY): Payer: Self-pay | Admitting: Emergency Medicine

## 2022-09-01 ENCOUNTER — Observation Stay (HOSPITAL_COMMUNITY)
Admission: EM | Admit: 2022-09-01 | Discharge: 2022-09-02 | Disposition: A | Discharge status: Home / Self Care | Payer: Medicare Other | Attending: Internal Medicine | Admitting: Internal Medicine

## 2022-09-01 DIAGNOSIS — R519 Headache, unspecified: Secondary | ICD-10-CM | POA: Insufficient documentation

## 2022-09-01 DIAGNOSIS — Z8673 Personal history of transient ischemic attack (TIA), and cerebral infarction without residual deficits: Secondary | ICD-10-CM | POA: Diagnosis not present

## 2022-09-01 DIAGNOSIS — Z85828 Personal history of other malignant neoplasm of skin: Secondary | ICD-10-CM | POA: Diagnosis not present

## 2022-09-01 DIAGNOSIS — Z794 Long term (current) use of insulin: Secondary | ICD-10-CM | POA: Diagnosis not present

## 2022-09-01 DIAGNOSIS — Z87891 Personal history of nicotine dependence: Secondary | ICD-10-CM | POA: Diagnosis not present

## 2022-09-01 DIAGNOSIS — R112 Nausea with vomiting, unspecified: Secondary | ICD-10-CM | POA: Diagnosis present

## 2022-09-01 DIAGNOSIS — E782 Mixed hyperlipidemia: Secondary | ICD-10-CM | POA: Insufficient documentation

## 2022-09-01 DIAGNOSIS — Z7901 Long term (current) use of anticoagulants: Secondary | ICD-10-CM | POA: Diagnosis not present

## 2022-09-01 DIAGNOSIS — Z85118 Personal history of other malignant neoplasm of bronchus and lung: Secondary | ICD-10-CM | POA: Insufficient documentation

## 2022-09-01 DIAGNOSIS — N179 Acute kidney failure, unspecified: Secondary | ICD-10-CM | POA: Insufficient documentation

## 2022-09-01 DIAGNOSIS — E1122 Type 2 diabetes mellitus with diabetic chronic kidney disease: Secondary | ICD-10-CM | POA: Diagnosis not present

## 2022-09-01 DIAGNOSIS — I10 Essential (primary) hypertension: Secondary | ICD-10-CM | POA: Diagnosis not present

## 2022-09-01 DIAGNOSIS — Z1152 Encounter for screening for COVID-19: Secondary | ICD-10-CM | POA: Diagnosis not present

## 2022-09-01 DIAGNOSIS — E860 Dehydration: Secondary | ICD-10-CM | POA: Insufficient documentation

## 2022-09-01 DIAGNOSIS — R1110 Vomiting, unspecified: Secondary | ICD-10-CM | POA: Diagnosis not present

## 2022-09-01 DIAGNOSIS — N1831 Chronic kidney disease, stage 3a: Secondary | ICD-10-CM | POA: Diagnosis not present

## 2022-09-01 DIAGNOSIS — E1165 Type 2 diabetes mellitus with hyperglycemia: Secondary | ICD-10-CM | POA: Diagnosis not present

## 2022-09-01 DIAGNOSIS — I129 Hypertensive chronic kidney disease with stage 1 through stage 4 chronic kidney disease, or unspecified chronic kidney disease: Secondary | ICD-10-CM | POA: Diagnosis not present

## 2022-09-01 DIAGNOSIS — I4891 Unspecified atrial fibrillation: Secondary | ICD-10-CM | POA: Insufficient documentation

## 2022-09-01 DIAGNOSIS — Z7982 Long term (current) use of aspirin: Secondary | ICD-10-CM | POA: Insufficient documentation

## 2022-09-01 DIAGNOSIS — E2740 Unspecified adrenocortical insufficiency: Secondary | ICD-10-CM

## 2022-09-01 DIAGNOSIS — E039 Hypothyroidism, unspecified: Secondary | ICD-10-CM | POA: Diagnosis not present

## 2022-09-01 DIAGNOSIS — R1114 Bilious vomiting: Secondary | ICD-10-CM | POA: Insufficient documentation

## 2022-09-01 HISTORY — DX: Unspecified atrial fibrillation: I48.91

## 2022-09-01 HISTORY — DX: Primary adrenocortical insufficiency: E27.1

## 2022-09-01 LAB — CBC WITH DIFFERENTIAL/PLATELET
Abs Immature Granulocytes: 0.03 10*3/uL (ref 0.00–0.07)
Basophils Absolute: 0 10*3/uL (ref 0.0–0.1)
Basophils Relative: 0 %
Eosinophils Absolute: 0 10*3/uL (ref 0.0–0.5)
Eosinophils Relative: 0 %
HCT: 32.5 % — ABNORMAL LOW (ref 39.0–52.0)
Hemoglobin: 10.4 g/dL — ABNORMAL LOW (ref 13.0–17.0)
Immature Granulocytes: 1 %
Lymphocytes Relative: 11 %
Lymphs Abs: 0.6 10*3/uL — ABNORMAL LOW (ref 0.7–4.0)
MCH: 28.5 pg (ref 26.0–34.0)
MCHC: 32 g/dL (ref 30.0–36.0)
MCV: 89 fL (ref 80.0–100.0)
Monocytes Absolute: 0.4 10*3/uL (ref 0.1–1.0)
Monocytes Relative: 8 %
Neutro Abs: 3.9 10*3/uL (ref 1.7–7.7)
Neutrophils Relative %: 80 %
Platelets: 202 10*3/uL (ref 150–400)
RBC: 3.65 MIL/uL — ABNORMAL LOW (ref 4.22–5.81)
RDW: 14.9 % (ref 11.5–15.5)
WBC: 5 10*3/uL (ref 4.0–10.5)
nRBC: 0 % (ref 0.0–0.2)

## 2022-09-01 LAB — CK: Total CK: 55 U/L (ref 49–397)

## 2022-09-01 LAB — URINALYSIS, ROUTINE W REFLEX MICROSCOPIC
Bacteria, UA: NONE SEEN
Bilirubin Urine: NEGATIVE
Glucose, UA: 50 mg/dL — AB
Ketones, ur: NEGATIVE mg/dL
Leukocytes,Ua: NEGATIVE
Nitrite: NEGATIVE
Protein, ur: 100 mg/dL — AB
Specific Gravity, Urine: 1.024 (ref 1.005–1.030)
pH: 5 (ref 5.0–8.0)

## 2022-09-01 LAB — LACTIC ACID, PLASMA
Lactic Acid, Venous: 1.3 mmol/L (ref 0.5–1.9)
Lactic Acid, Venous: 1.7 mmol/L (ref 0.5–1.9)

## 2022-09-01 LAB — HEPATIC FUNCTION PANEL
ALT: 14 U/L (ref 0–44)
AST: 16 U/L (ref 15–41)
Albumin: 3 g/dL — ABNORMAL LOW (ref 3.5–5.0)
Alkaline Phosphatase: 76 U/L (ref 38–126)
Bilirubin, Direct: 0.1 mg/dL (ref 0.0–0.2)
Indirect Bilirubin: 0.5 mg/dL (ref 0.3–0.9)
Total Bilirubin: 0.6 mg/dL (ref 0.3–1.2)
Total Protein: 6 g/dL — ABNORMAL LOW (ref 6.5–8.1)

## 2022-09-01 LAB — BASIC METABOLIC PANEL
Anion gap: 8 (ref 5–15)
BUN: 25 mg/dL — ABNORMAL HIGH (ref 8–23)
CO2: 27 mmol/L (ref 22–32)
Calcium: 8.5 mg/dL — ABNORMAL LOW (ref 8.9–10.3)
Chloride: 102 mmol/L (ref 98–111)
Creatinine, Ser: 1.48 mg/dL — ABNORMAL HIGH (ref 0.61–1.24)
GFR, Estimated: 50 mL/min — ABNORMAL LOW (ref >60)
Glucose, Bld: 234 mg/dL — ABNORMAL HIGH (ref 70–99)
Potassium: 3.6 mmol/L (ref 3.5–5.1)
Sodium: 137 mmol/L (ref 135–145)

## 2022-09-01 LAB — RESP PANEL BY RT-PCR (RSV, FLU A&B, COVID)  RVPGX2
Influenza A by PCR: NEGATIVE
Influenza B by PCR: NEGATIVE
Resp Syncytial Virus by PCR: NEGATIVE
SARS Coronavirus 2 by RT PCR: NEGATIVE

## 2022-09-01 LAB — GLUCOSE, CAPILLARY: Glucose-Capillary: 333 mg/dL — ABNORMAL HIGH (ref 70–99)

## 2022-09-01 LAB — LIPASE, BLOOD: Lipase: 23 U/L (ref 11–51)

## 2022-09-01 LAB — FOLATE: Folate: 12 ng/mL (ref >5.9)

## 2022-09-01 LAB — TSH: TSH: 0.357 u[IU]/mL (ref 0.350–4.500)

## 2022-09-01 LAB — PROCALCITONIN: Procalcitonin: 0.88 ng/mL

## 2022-09-01 LAB — VITAMIN B12: Vitamin B-12: 306 pg/mL (ref 180–914)

## 2022-09-01 LAB — CBG MONITORING, ED: Glucose-Capillary: 223 mg/dL — ABNORMAL HIGH (ref 70–99)

## 2022-09-01 MED ORDER — ORAL CARE MOUTH RINSE: 15.0000 mL | OROMUCOSAL | Status: DC | PRN

## 2022-09-01 MED ORDER — INSULIN ASPART 100 UNIT/ML IJ SOLN
0.0000 [IU] | Freq: Three times a day (TID) | INTRAMUSCULAR | Status: DC
  Administered 2022-09-02: 5 [IU] via SUBCUTANEOUS
  Administered 2022-09-02: 9 [IU] via SUBCUTANEOUS

## 2022-09-01 MED ORDER — ASPIRIN 81 MG PO TBEC
81.0000 mg | DELAYED_RELEASE_TABLET | Freq: Every day | ORAL | Status: DC
  Administered 2022-09-01: 81 mg via ORAL
  Filled 2022-09-01: qty 1

## 2022-09-01 MED ORDER — METOPROLOL SUCCINATE ER 50 MG PO TB24
50.0000 mg | ORAL_TABLET | Freq: Every day | ORAL | Status: DC
  Administered 2022-09-01: 50 mg via ORAL
  Filled 2022-09-01: qty 1

## 2022-09-01 MED ORDER — ATORVASTATIN CALCIUM 40 MG PO TABS
80.0000 mg | ORAL_TABLET | ORAL | Status: DC
  Administered 2022-09-02: 80 mg via ORAL
  Filled 2022-09-01: qty 2

## 2022-09-01 MED ORDER — ACETAMINOPHEN 325 MG PO TABS: 650.0000 mg | ORAL_TABLET | Freq: Four times a day (QID) | ORAL | Status: DC | PRN

## 2022-09-01 MED ORDER — ACETAMINOPHEN 650 MG RE SUPP: 650.0000 mg | Freq: Four times a day (QID) | RECTAL | Status: DC | PRN

## 2022-09-01 MED ORDER — IOHEXOL 300 MG/ML  SOLN
100.0000 mL | Freq: Once | INTRAMUSCULAR | Status: AC | PRN
  Administered 2022-09-01: 100 mL via INTRAVENOUS

## 2022-09-01 MED ORDER — POTASSIUM CHLORIDE IN NACL 20-0.9 MEQ/L-% IV SOLN: INTRAVENOUS | Status: DC

## 2022-09-01 MED ORDER — PANTOPRAZOLE SODIUM 40 MG IV SOLR: 40.0000 mg | Freq: Two times a day (BID) | INTRAVENOUS | Status: DC

## 2022-09-01 MED ORDER — ENOXAPARIN SODIUM 40 MG/0.4ML IJ SOSY
40.0000 mg | PREFILLED_SYRINGE | INTRAMUSCULAR | Status: DC
  Administered 2022-09-01: 40 mg via SUBCUTANEOUS
  Filled 2022-09-01: qty 0.4

## 2022-09-01 MED ORDER — FINASTERIDE 5 MG PO TABS
5.0000 mg | ORAL_TABLET | Freq: Every day | ORAL | Status: DC
  Administered 2022-09-01: 5 mg via ORAL
  Filled 2022-09-01: qty 1

## 2022-09-01 MED ORDER — SODIUM CHLORIDE 0.9 % IV SOLN
INTRAVENOUS | Status: DC
  Administered 2022-09-01: 1000 mL via INTRAVENOUS

## 2022-09-01 MED ORDER — TRAZODONE HCL 50 MG PO TABS
50.0000 mg | ORAL_TABLET | Freq: Every day | ORAL | Status: DC
  Administered 2022-09-01: 50 mg via ORAL
  Filled 2022-09-01: qty 1

## 2022-09-01 MED ORDER — ONDANSETRON HCL 4 MG/2ML IJ SOLN
4.0000 mg | Freq: Four times a day (QID) | INTRAMUSCULAR | Status: DC
  Administered 2022-09-01 – 2022-09-02 (×3): 4 mg via INTRAVENOUS
  Filled 2022-09-01 (×4): qty 2

## 2022-09-01 MED ORDER — ESCITALOPRAM OXALATE 10 MG PO TABS
30.0000 mg | ORAL_TABLET | Freq: Every day | ORAL | Status: DC
  Administered 2022-09-02: 30 mg via ORAL
  Filled 2022-09-01: qty 3

## 2022-09-01 MED ORDER — SODIUM CHLORIDE 0.9 % IV BOLUS
1000.0000 mL | Freq: Once | INTRAVENOUS | Status: AC
  Administered 2022-09-01: 1000 mL via INTRAVENOUS

## 2022-09-01 MED ORDER — LEVOTHYROXINE SODIUM 75 MCG PO TABS
75.0000 ug | ORAL_TABLET | Freq: Every day | ORAL | Status: DC
  Administered 2022-09-02: 75 ug via ORAL
  Filled 2022-09-01: qty 1

## 2022-09-01 MED ORDER — INSULIN GLARGINE-YFGN 100 UNIT/ML ~~LOC~~ SOLN
5.0000 [IU] | Freq: Every day | SUBCUTANEOUS | Status: DC
  Administered 2022-09-01: 5 [IU] via SUBCUTANEOUS
  Filled 2022-09-01 (×2): qty 0.05

## 2022-09-01 MED ORDER — HYDROCORTISONE SOD SUC (PF) 100 MG IJ SOLR
40.0000 mg | Freq: Every day | INTRAMUSCULAR | Status: DC
  Administered 2022-09-01: 40 mg via INTRAVENOUS
  Filled 2022-09-01 (×2): qty 2

## 2022-09-01 MED ORDER — INSULIN ASPART 100 UNIT/ML IJ SOLN
0.0000 [IU] | Freq: Every day | INTRAMUSCULAR | Status: DC
  Administered 2022-09-01: 4 [IU] via SUBCUTANEOUS

## 2022-09-01 MED ORDER — HYDROCORTISONE SOD SUC (PF) 100 MG IJ SOLR
50.0000 mg | Freq: Every morning | INTRAMUSCULAR | Status: DC
  Administered 2022-09-02: 50 mg via INTRAVENOUS
  Filled 2022-09-01: qty 2

## 2022-09-01 MED ORDER — PANTOPRAZOLE SODIUM 40 MG IV SOLR
40.0000 mg | Freq: Two times a day (BID) | INTRAVENOUS | Status: DC
  Administered 2022-09-01 – 2022-09-02 (×2): 40 mg via INTRAVENOUS
  Filled 2022-09-01 (×2): qty 10

## 2022-09-01 MED ORDER — TAMSULOSIN HCL 0.4 MG PO CAPS
0.4000 mg | ORAL_CAPSULE | ORAL | Status: DC
  Administered 2022-09-02: 0.4 mg via ORAL
  Filled 2022-09-01: qty 1

## 2022-09-01 NOTE — Hospital Course (Signed)
71 year old male with a history of non-small cell lung cancer in remission previously on Keytruda (since May 2022 through Jan 2024), adrenal insufficiency on hydrocortisone supplementation, hypertension, hyperlipidemia, diabetes mellitus type 2, BPH, 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  Notably, the patient has been struggling with early satiety with nausea and vomiting for the better part of 3 months.  He last saw GI, Dr. Jena Gauss on 08/19/2022 with plans for upcoming EGD. However, in the last 24 to 48 hours he has had increased frequency of his bilious vomiting, up to 4 episodes in the last 24 hours.  There is no hematemesis.  The patient has had increasing generalized weakness in the past 24 hours.  As result he presented to the emergency department for further evaluation.  The patient's spouse at the bedside supplements the history.  Notably, she has noted some low-grade temperatures from 99.0 F to 100.0 F in the past 24 hours.  The patient has had a frontal headache.  He denies any neck pain.  He denies any chest pain, shortness breath, coughing, hemoptysis, abdominal pain, dysuria, hematuria.  At baseline, the patient is able to ambulate with the assistance of a rollator.  He is able to make transfers.  However in the last 24 hours he has had increasing generalized weakness with difficulty even getting out of bed.  Wife states that she doubled up his dose of hydrocortisone on 08/31/2022.  She also doubled up on his morning dose of hydrocortisone on the morning of 09/01/2022.  His last dose of apixaban was on the evening of 08/30/2022.  Notably, the patient had a recent hospital admission at [facility] from 05/04/2022 to 05/13/2022.  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 coli and Clostridium bacteremia.  He was also treated for bilateral aspiration pneumonia.   He was subsequently discharged home on his baseline dose of hydrocortisone, 20 mg in the morning, 10 mg in the evening.  In the emergency department, the patient was afebrile hemodynamically stable.  He did have low-grade temperature 99.3 F.  He was mildly tachycardic HR 105-110.  He was hemodynamically stable.  Oxygen saturation 95% room air.  CT of the chest showed tiny left pleural effusion.  There was a 2-3 mm RUL nodule.  There are mild areas of bronchial wall thickening and interstitial changes.  There is no consolidation or pneumothorax.  CT of the abdomen and pelvis was negative for any gallbladder wall thickening.  There was moderate diffuse colonic stool with scattered diverticula.  Small bowel is nondilated.  Stomach is collapsed but with wall thickening and some adjacent vascular engorgement.  There is prostamegaly.  WBC 5.0, hemoglobin 10.4, platelets 204,000.  Sodium 137, potassium 3.6, bicarbonate 27, serum creatinine 1.48.  AST 14, ALT 14, alk phosphatase 76, total bilirubin 0.6, lipase 23. The patient was started on IV fluids.  GI was consulted to assist with management.

## 2022-09-01 NOTE — ED Notes (Signed)
Pt given urinal for urine sample 

## 2022-09-01 NOTE — H&P (View-Only) (Signed)
Gastroenterology Consult   Referring Provider: No ref. provider found Primary Care Physician:  Jonathon Bellows, DO Primary Gastroenterologist:  Roetta Sessions, MD   Patient ID: Jack Mcbride; [id number]; 18-Jun-1950   Admit date: 09/01/2022  LOS: 0 days   Date of Consultation: 09/01/2022  Reason for Consultation:  n/v    History of Present Illness   Jack Mcbride is a 72 y.o. male with history of diabetes, gastroparesis, adrenal insufficiency, history of ischemic colitis while in [facility] last year, GERD, hypertension, PAD, PVD status post stenting, history of embolic strokes, A-fib, BPH, kidney stones, loop recorder insertion, history of non-small cell lung cancer just came off Keytruda several weeks ago who presented to the emergency department due to low-grade fever, weakness, abdominal pain, nausea/vomiting.  Seen in the office recently on June 14 with recurrent early satiety, nausea/vomiting.  59-month history of early satiety, nausea with intermittent vomiting.  Symptoms had been well-controlled on PPI twice daily taking Zofran as needed.  Vocal cord surgery at [facility] earlier this year, was not given stress doses of corticosteroids and had adrenal crisis which put him in the hospital for 4 days.  He continues to take hydrocortisone.  Previously it was felt that he is not a great candidate for Reglan therapy in the future given his prior 8 strokes and limited mobility and baseline tremors.   Patient was on vacation last week and had a lot of nausea.  At baseline he takes Zofran every morning at 8 AM.  Sometimes he takes additional doses.  Over the past few days has had some issues with constipation, has had a BM after taking MiraLAX.  Typically takes MiraLAX as needed.  Take stool softeners daily.  Yesterday he woke up with nausea/vomiting and low-grade temperature of 100.5.  Nonbloody emesis, yellow/green in color.  Noted weakness, difficulty ambulating.  Increased tremors.  Had  changed diabetic sensor earlier, not sure if working appropriately, Reading low sugar with glucose of 55.  Ate to try to get his sugar up, finger prick with glucose of 188.  Also complaining of lower abdominal pain associated with this illness.  No cough.  No ill contacts.  Denies dysuria.  Denies heartburn, dysphagia, melena, rectal bleeding.  Scheduled for EGD as an outpatient tomorrow.  Last dose of Eliquis was Tuesday evening.  Missed preop appointment today due to illness.  In the ED: Lactic acid 1.3, white blood cell count 5000, hemoglobin 10.4, creatinine 1.48, albumin 3, LFTs normal, respiratory panel negative, urinalysis with small blood, glucose 50, protein 100.  CT chest/abdomen/pelvis with contrast, see full report below.  Collapse stomach but wall is somewhat thickened and there is some adjacent vascular engorgement, small anterior midline abdominal wall hernia above the umbilicus with involvement of small bowel loop but no obstruction, 1 acute to subacute anterior left-sided rib fracture, chest port noted, 3 mm right-sided lung nodule.  GES May 2021: Minimally delayed gastric emptying, normal at hours 1, 2, 3 but 86% emptied at 4 hours with normal being greater than or equal to 90%.  Colonoscopy June 2020: -Diverticulosis -two 2 to 5 mm polyps in the ascending colon and in the cecum, one was tubular adenoma -1 more colonoscopy in 5 years  EGD October 2019: -Normal esophagus -Erosive gastropathy -Duodenal erosions without bleeding -Gastric biopsy negative for H. pylori  Prior to Admission medications   Medication Sig Start Date End Date Taking? Authorizing Provider  amLODipine (NORVASC) 10 MG tablet Take 10 mg by mouth  daily.    [provider]  apixaban (ELIQUIS) 5 MG TABS tablet Take 5 mg by mouth 2 (two) times daily.    [provider]  aspirin 81 MG tablet Take 81 mg by mouth at bedtime.    [provider]  atorvastatin (LIPITOR) 80 MG tablet Take  80 mg by mouth every Monday, Wednesday, and Friday. 10/13/15   [provider]  cetirizine (ZYRTEC) 10 MG tablet Take 10 mg by mouth daily as needed for allergies.    [provider]  Continuous Blood Gluc Receiver (FREESTYLE LIBRE 2 READER) DEVI USE TO TEST BLOOD GLUCOSE LEVELS ONCE A DAY E11.9 11/02/21   [provider]  Continuous Blood Gluc Sensor (FREESTYLE LIBRE 2 SENSOR) MISC TEST BLOOD GLUCOSE LEVEL ONCE A DAY E11.9 11/02/21   [provider]  escitalopram (LEXAPRO) 10 MG tablet Take 30 mg by mouth daily with breakfast. 12/05/19   [provider]  finasteride (PROSCAR) 5 MG tablet Take 5 mg by mouth at bedtime. 12/31/21   [provider]  HUMALOG KWIKPEN 100 UNIT/ML KwikPen Inject 18-20 Units into the skin See admin instructions. Inject 20 units under the skin in the morning with breakfast and inject 18 units with lunch 07/19/21   [provider]  hydrocortisone (CORTEF) 10 MG tablet Take 10 mg by mouth every evening. 06/01/21   [provider]  hydrocortisone (CORTEF) 20 MG tablet Take 20 mg by mouth every morning. 05/09/21   [provider]  insulin glargine (LANTUS) 100 UNIT/ML injection Inject 16 Units into the skin at bedtime.    [provider]  levothyroxine (SYNTHROID) 75 MCG tablet Take 75 mcg by mouth daily before breakfast. 12/31/21   [provider]  metoprolol succinate (TOPROL-XL) 50 MG 24 hr tablet Take 50 mg by mouth daily. 03/16/22   [provider]  ondansetron (ZOFRAN-ODT) 4 MG disintegrating tablet DISSOLVE 1 TAB IN MOUTH EVERY 4 HOURS AS NEEDED FOR NAUSEA AND VOMIT 03/22/22   Rourk, [name], MD  pantoprazole (PROTONIX) 40 MG tablet TAKE 1 TABLET BY MOUTH TWICE A DAY BEFORE A MEAL 11/10/21   Gelene Mink, NP  tamsulosin (FLOMAX) 0.4 MG CAPS capsule Take 0.4 mg by mouth at bedtime. 12/26/16   [provider]  traZODone (DESYREL) 50 MG tablet Take 50 mg by mouth at  bedtime.  02/05/17   [provider]    Current Facility-Administered Medications  Medication Dose Route Frequency Provider Last Rate Last Admin   0.9 %  sodium chloride infusion   Intravenous Continuous Vanetta Mulders, MD   Held at 09/01/22 [phone]   Current Outpatient Medications  Medication Sig Dispense Refill   amLODipine (NORVASC) 10 MG tablet Take 10 mg by mouth daily.     apixaban (ELIQUIS) 5 MG TABS tablet Take 5 mg by mouth 2 (two) times daily.     aspirin 81 MG tablet Take 81 mg by mouth at bedtime.     atorvastatin (LIPITOR) 80 MG tablet Take 80 mg by mouth every Monday, Wednesday, and Friday.     cetirizine (ZYRTEC) 10 MG tablet Take 10 mg by mouth daily as needed for allergies.     Continuous Blood Gluc Receiver (FREESTYLE LIBRE 2 READER) DEVI USE TO TEST BLOOD GLUCOSE LEVELS ONCE A DAY E11.9     Continuous Blood Gluc Sensor (FREESTYLE LIBRE 2 SENSOR) MISC TEST BLOOD GLUCOSE LEVEL ONCE A DAY E11.9     escitalopram (LEXAPRO) 10 MG tablet Take 30 mg  by mouth daily with breakfast.     finasteride (PROSCAR) 5 MG tablet Take 5 mg by mouth at bedtime.     HUMALOG KWIKPEN 100 UNIT/ML KwikPen Inject 18-20 Units into the skin See admin instructions. Inject 20 units under the skin in the morning with breakfast and inject 18 units with lunch     hydrocortisone (CORTEF) 10 MG tablet Take 10 mg by mouth every evening.     hydrocortisone (CORTEF) 20 MG tablet Take 20 mg by mouth every morning.     insulin glargine (LANTUS) 100 UNIT/ML injection Inject 16 Units into the skin at bedtime.     levothyroxine (SYNTHROID) 75 MCG tablet Take 75 mcg by mouth daily before breakfast.     metoprolol succinate (TOPROL-XL) 50 MG 24 hr tablet Take 50 mg by mouth daily.     ondansetron (ZOFRAN-ODT) 4 MG disintegrating tablet DISSOLVE 1 TAB IN MOUTH EVERY 4 HOURS AS NEEDED FOR NAUSEA AND VOMIT 30 tablet 1   pantoprazole (PROTONIX) 40 MG tablet TAKE 1 TABLET BY MOUTH TWICE A DAY BEFORE A MEAL 180 tablet  3   tamsulosin (FLOMAX) 0.4 MG CAPS capsule Take 0.4 mg by mouth at bedtime.  3   traZODone (DESYREL) 50 MG tablet Take 50 mg by mouth at bedtime.   1    Allergies as of 09/01/2022 - Review Complete 09/01/2022  Allergen Reaction Noted   Codeine Itching 09/05/2011    Past Medical History:  Diagnosis Date   A-fib [facility])    Adrenal insufficiency (Addison's disease) (HCC)    Benign prostate hyperplasia    Diabetes (HCC)    Elevated PSA    GERD (gastroesophageal reflux disease)    History of kidney stones    Hypercholesterolemia    Hypertension    Impotence of organic origin    Kidney stone    PAD (peripheral artery disease) (HCC)    Peripheral artery disease (HCC)    s/p stenting   Skin cancer    on nose   Stroke (cerebrum) (HCC)    Stroke (HCC)    2013 and 2017; 2019   Urolithiasis     Past Surgical History:  Procedure Laterality Date   BACK SURGERY  05/2016   BIOPSY  12/28/2017   Procedure: BIOPSY;  Surgeon: Corbin Ade, MD;  Location: [facility];  Service: Endoscopy;;  gastric bx's   COLONOSCOPY WITH PROPOFOL N/A 11/20/2017   Dr. Jena Gauss: Diverticulosis,two 5 to 30 mm polyps in the cecum and rectum.  Cecal polypectomy site tattooed.  Pathology revealed tubular adenoma, no high-grade dysplasia.  Recommend colonoscopy in 6 months.   COLONOSCOPY WITH PROPOFOL N/A 09/03/2018   sigmoid and descending colon diverticulosis, two 2-5 mm polyps in ascending colon and cecum, s/p removal. Polypoid mucosa and tubular adenoma. 5 year surveillance.    ESOPHAGOGASTRODUODENOSCOPY (EGD) WITH PROPOFOL N/A 12/28/2017   Dr. Jena Gauss: Normal esophagus, erosive gastropathy, duodenal erosions without bleeding.  Gastric biopsy benign.  No H. pylori on biopsy.   HERNIA REPAIR     umbilical   LOOP RECORDER INSERTION  06/27/2017   PERCUTANEOUS STENT INTERVENTION Left    Left leg for PAD   POLYPECTOMY  11/20/2017   Procedure: POLYPECTOMY;  Surgeon: Corbin Ade, MD;  Location: [facility];   Service: Endoscopy;;  cecal   POLYPECTOMY  09/03/2018   Procedure: POLYPECTOMY;  Surgeon: Corbin Ade, MD;  Location: [facility];  Service: Endoscopy;;  colon   SHOULDER SURGERY Right  torn rotator cuff    Family History  Problem Relation Age of Onset   Heart attack Mother    Diabetes Father    Colon cancer Neg Hx    Gastric cancer Neg Hx    Esophageal cancer Neg Hx     Social History   Socioeconomic History   Marital status: Married    Spouse name: Not on file   Number of children: Not on file   Years of education: Not on file   Highest education level: Not on file  Occupational History   Not on file  Tobacco Use   Smoking status: Former    Packs/day: 0.25    Years: 48.00    Additional pack years: 0.00    Total pack years: 12.00    Types: Cigarettes    Start date: 03/07/1969    Quit date: 04/03/2018    Years since quitting: 4.4   Smokeless tobacco: Never  Vaping Use   Vaping Use: Never used  Substance and Sexual Activity   Alcohol use: No   Drug use: No   Sexual activity: Yes    Birth control/protection: None  Other Topics Concern   Not on file  Social History Narrative   Not on file   Social Determinants of Health   Financial Resource Strain: Not on file  Food Insecurity: Not on file  Transportation Needs: Not on file  Physical Activity: Not on file  Stress: Not on file  Social Connections: Not on file  Intimate Partner Violence: Not on file     Review of System:   General: Negative for anorexia, weight loss, fever, chills, fatigue, +weakness. Eyes: Negative for vision changes.  ENT: Negative for hoarseness, difficulty swallowing , nasal congestion. CV: Negative for chest pain, angina, palpitations, dyspnea on exertion, peripheral edema.  Respiratory: Negative for dyspnea at rest, dyspnea on exertion, cough, sputum, wheezing.  GI: See history of present illness. GU:  Negative for dysuria, hematuria, urinary incontinence, urinary frequency,  nocturnal urination.  MS: Negative for joint pain, low back pain.  Derm: Negative for rash or itching.  Neuro: Negative for   seizure, frequent headaches, memory loss, confusion. Left sided weakness.  Psych: Negative for anxiety, depression, suicidal ideation, hallucinations.  Endo: Negative for unusual weight change.  Heme: Negative for bruising or bleeding. Allergy: Negative for rash or hives.      Physical Examination:   Vital signs in last 24 hours: Temp:  [99.1 F (37.3 C)-99.3 F (37.4 C)] 99.1 F (37.3 C) (06/27 1155) Pulse Rate:  [99-104] 100 (06/27 1330) Resp:  [15-22] 17 (06/27 1330) BP: (139-159)/(74-86) 148/77 (06/27 1330) SpO2:  [94 %-97 %] 97 % (06/27 1330) Weight:  [86.2 kg] 86.2 kg (06/27 0843)    General: Well-nourished, well-developed in no acute distress.  Head: Normocephalic, atraumatic.   Eyes: Conjunctiva pink, no icterus. Mouth: Oropharyngeal mucosa moist and pink  Neck: Supple without thyromegaly, masses, or lymphadenopathy.  Lungs: Clear to auscultation bilaterally.  Heart: Regular rate and rhythm, no murmurs rubs or gallops.  Abdomen: Bowel sounds are normal, nontender, nondistended, no hepatosplenomegaly or masses, no abdominal bruits or hernia , no rebound or guarding.   Rectal: not performed Extremities: No lower extremity edema, clubbing, deformity.  Neuro: Alert and oriented x 4 ,   Skin: Warm and dry, no rash or jaundice.   Psych: Alert and cooperative, normal mood and affect.        Intake/Output from previous day: No intake/output data recorded. Intake/Output this shift:  No intake/output data recorded.  Lab Results:   CBC Recent Labs    09/01/22 0856  WBC 5.0  HGB 10.4*  HCT 32.5*  MCV 89.0  PLT 202   BMET Recent Labs    09/01/22 0856  NA 137  K 3.6  CL 102  CO2 27  GLUCOSE 234*  BUN 25*  CREATININE 1.48*  CALCIUM 8.5*   LFT Recent Labs    09/01/22 0856  BILITOT 0.6  BILIDIR 0.1  IBILI 0.5  ALKPHOS 76  AST 16   ALT 14  PROT 6.0*  ALBUMIN 3.0*    Lipase Recent Labs    09/01/22 0856  LIPASE 23    PT/INR No results for input(s): "LABPROT", "INR" in the last 72 hours.   Hepatitis Panel No results for input(s): "HEPBSAG", "HCVAB", "HEPAIGM", "HEPBIGM" in the last 72 hours.   Imaging Studies:   CT CHEST ABDOMEN PELVIS W CONTRAST  Result Date: 09/01/2022 CLINICAL DATA:  Vomiting.  Weakness EXAM: CT CHEST, ABDOMEN, AND PELVIS WITH CONTRAST TECHNIQUE: Multidetector CT imaging of the chest, abdomen and pelvis was performed following the standard protocol during bolus administration of intravenous contrast. RADIATION DOSE REDUCTION: This exam was performed according to the departmental dose-optimization program which includes automated exposure control, adjustment of the mA and/or kV according to patient size and/or use of iterative reconstruction technique. CONTRAST:  OMNIPAQUE IOHEXOL 300 MG/ML  SOLN COMPARISON:  CT angiogram 01/25/2018 FINDINGS: CT CHEST FINDINGS Cardiovascular: Small pericardial effusion. Coronary artery calcifications are seen. The heart is nonenlarged. The thoracic aorta has a normal course and caliber with scattered vascular calcifications. There is a bovine type aortic arch. There is a right IJ chest port in place with the tip extending to the central SVC above the right atrium. Mediastinum/Nodes: No abnormal lymph node enlargement identified in the axillary regions, hilum or mediastinum. Small thyroid gland. Normal caliber thoracic esophagus. Lungs/Pleura: Tiny left pleural effusion. No right-sided pleural effusion. 2-3 mm right upper lobe lung nodule on series 4, image 26 laterally. There is breathing motion throughout the examination. There are mild areas of bronchial wall thickening and interstitial changes, nonspecific and possibly chronic. No consolidation or pneumothorax. Musculoskeletal: Degenerative changes of the spine. Osteopenia. There are some old left-sided rib  fractures identified. However there is a fracture of the anterior aspect of the left sixth rib which may be more acute to subacute. Please correlate with history. CT ABDOMEN PELVIS FINDINGS Hepatobiliary: No focal liver abnormality is seen. No gallstones, gallbladder wall thickening, or biliary dilatation. Pancreas: Diffuse fatty atrophy of the pancreas.  No mass lesion. Spleen: Normal in size without focal abnormality. Adrenals/Urinary Tract: Adrenal glands are preserved. No enhancing renal mass or collecting system dilatation. Ureters have normal course and caliber dome of the bladder. Bladder has a preserved contour but slight wall thickening. Stomach/Bowel: No oral contrast. There is moderate diffuse colonic stool. Few left-sided colonic diverticula. Appendix is poorly seen in the right lower quadrant but no pericecal stranding or fluid. Small bowel is nondilated. There is a midline anterior abdominal wall small hernia involving loops of small bowel without obstruction. This is above the umbilicus. Stomach is collapsed but there is wall thickening along the stomach with some adjacent vascular engorgement. Please correlate with any symptoms of gastritis or other gastric pathology. Vascular/Lymphatic: Moderate vascular calcifications along the aorta and branch vessels. There are areas of stenosis along the iliac vessels. Normal caliber IVC. No specific abnormal lymph node enlargement identified in the abdomen and pelvis.  Reproductive: Enlarged prostate with mass effect along the base of the bladder. Please correlate with the patient's PSA. Other: No free air or free fluid. Musculoskeletal: Scattered degenerative changes of the spine and pelvis. IMPRESSION: Collapsed stomach but wall is somewhat thickened and there is some adjacent vascular engorgement. Please correlate for any particular symptoms including for gastritis or other gastric pathology. No bowel obstruction, free air or free fluid. Few colonic  diverticula. Diffuse stool. Small anterior midline abdominal wall hernia above the umbilicus with involvement of small bowel loop. Again no small bowel obstruction. Tiny left pleural effusion. Chest port. Multiple rib fractures. Many of which are old. There is 1 acute to subacute anterior left-sided rib fracture. Please correlate with symptoms. 3 mm right-sided lung nodule. Although likely benign, if the patient is high-risk, given the morphology and/or location of this nodule a non-contrast chest CT can be considered in 12 months.This recommendation follows the consensus statement: Guidelines for Management of Incidental Pulmonary Nodules Detected on CT Images: From the Fleischner Society 2017; Radiology 2017; 284:228-243. Electronically Signed   By: Karen Kays M.D.   On: 09/01/2022 10:47   CT Head Wo Contrast  Result Date: 09/01/2022 CLINICAL DATA:  Headache, neuro deficit EXAM: CT HEAD WITHOUT CONTRAST TECHNIQUE: Contiguous axial images were obtained from the base of the skull through the vertex without intravenous contrast. RADIATION DOSE REDUCTION: This exam was performed according to the departmental dose-optimization program which includes automated exposure control, adjustment of the mA and/or kV according to patient size and/or use of iterative reconstruction technique. COMPARISON:  None Available. FINDINGS: Brain: No hemorrhage. No hydrocephalus. No CT evidence of an acute cortical infarct. Sequela of moderate chronic microvascular ischemic change with chronic small-vessel infarcts in the left basal ganglia and thalamocapsular regions bilaterally. In the. Generalized volume loss left middle cranial fossa arachnoid cyst. Vascular: No hyperdense vessel or unexpected calcification. Skull: Normal. Negative for fracture or focal lesion. Sinuses/Orbits: No middle ear or mastoid effusion. Mucosal thickening right maxillary sinus. Orbits are unremarkable. Other: None. IMPRESSION: 1. No acute intracranial  process. 2. Sequela of moderate chronic microvascular ischemic change with chronic small-vessel infarcts in the left basal ganglia and thalamocapsular regions bilaterally Electronically Signed   By: Lorenza Cambridge M.D.   On: 09/01/2022 10:39   DG Chest Port 1 View  Result Date: 09/01/2022 CLINICAL DATA:  16109 Infection [id number] EXAM: PORTABLE CHEST 1 VIEW COMPARISON:  None Available. FINDINGS: Right-sided chest port terminates at the superior cavoatrial junction. Overlying loop recorder. Mild cardiomegaly. Aortic atherosclerosis. Minimal streaky bibasilar opacities. No pleural effusion or pneumothorax. IMPRESSION: Minimal streaky bibasilar opacities, atelectasis versus infiltrate. Electronically Signed   By: Duanne Guess D.O.   On: 09/01/2022 09:51  [4 week]  Assessment:   72 year old male with multiple comorbidities including history of diabetes, gastroparesis, adrenal insufficiency, history of ischemic colitis, GERD, hypertension, PAD, PVD status post stenting, history of 8 strokes, A-fib, BPH, kidney stones, loop recorder insertion, history of non-small cell lung cancer just came off Keytruda several weeks ago who presented to the emergency department due to low-grade fever, weakness, abdominal pain, nausea/vomiting.  GI consulted for vomiting.  Nausea/vomiting/abdominal pain: Chronic intermittent episodes of nausea/vomiting for the past several years.  History of mild delayed gastric emptying study as outlined above.  Was supposed to have an outpatient EGD tomorrow, last dose of Eliquis Tuesday evening.  Woke up with acute onset nausea/vomiting yesterday.  No hematemesis.  No melena or rectal bleeding.  Complains of vague lower abdominal pain associated  with symptoms.  Noted increased nausea last week following vacation.  Increased Zofran use.  Presents with mild dehydration.  Patient has had low-grade fever.  Urinalysis not typical for UTI.  Respiratory panel negative.  CT chest/abdomen/pelvis with  small anterior midline abdominal wall hernia above the umbilicus involving small bowel loop but no bowel obstruction.  Collapse stomach but wall somewhat thickened with some adjacent vascular engorgement.  1 acute to subacute anterior left-sided rib fracture.  Had plan for outpatient EGD to further evaluate chronic intermittent nausea/vomiting.  Suspect vomiting multifactorial.    Plan:    Trial of clear liquids per patient/wife request. N.p.o. after midnight. EGD tomorrow. Supportive measures. PPI twice daily. Recommend stress dose for adrenal insufficiency. HOLD Eliquis.   LOS: 0 days   We would like to thank you for the opportunity to participate in the care of Jack Mcbride.  Leanna Battles. [facility] [phone] 6/27/20241:49 PM

## 2022-09-01 NOTE — ED Provider Notes (Signed)
[place] EMERGENCY DEPARTMENT AT [facility] Provider Note   CSN: [id number] Arrival date & time: 09/01/22  0830     History  Chief Complaint  Patient presents with   Emesis    Jack Mcbride is a 72 y.o. male.  Patient started vomiting bile yesterday.  This has happened before but usually does not last as long as continued his wife is concerned that he may be getting dehydrated.  Patient is followed by Dr. Jena Gauss from gastroenterology there was post to be a preop today for an upper endoscopy tomorrow.  Patient also followed at [facility] for stage IV lung cancer in remission.  Also has adrenal insufficiency patient's wife did increase his Cortef medicine doubled it today to help with the stress of the persistent vomiting.  Patient is still receiving immunotherapy for the lung cancer.  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  But most of that was yesterday.  Also low-grade temps of 99 pretty persistently.  Vital signs here Temp 99.3 pulse 104 respirations 22 blood pressure 154/86 oxygen saturation on room air is 94%.  Patient normally is on Cortef 20 mg in the morning she doubled that to 40 and at 5 PM he is normally on 10 mg that will probably need to be doubled as well.  Past medical history also includes diabetes peripheral artery disease hypertension.  Also patient has not been eating and drinking very well.  Also patient has had a headache as well starting yesterday.  Patient is a former smoker he quit in 2020.       Home Medications Prior to Admission medications   Medication Sig Start Date End Date Taking? Authorizing Provider  amLODipine (NORVASC) 10 MG tablet Take 10 mg by mouth daily.   Yes [provider]  aspirin 81 MG tablet Take 81 mg by mouth at bedtime.   Yes [provider]  atorvastatin (LIPITOR) 80 MG tablet Take 80 mg by mouth  every Monday, Wednesday, and Friday. 10/13/15  Yes [provider]  cetirizine (ZYRTEC) 10 MG tablet Take 10 mg by mouth daily as needed for allergies.   Yes [provider]  escitalopram (LEXAPRO) 10 MG tablet Take 30 mg by mouth daily with breakfast. 12/05/19  Yes [provider]  finasteride (PROSCAR) 5 MG tablet Take 5 mg by mouth at bedtime. 12/31/21  Yes [provider]  HUMALOG KWIKPEN 100 UNIT/ML KwikPen Inject 18-20 Units into the skin See admin instructions. Inject 20 units under the skin in the morning with breakfast and inject 18 units with lunch 18u supper 07/19/21  Yes [provider]  hydrocortisone (CORTEF) 10 MG tablet Take 20 mg by mouth every evening. 06/01/21  Yes [provider]  hydrocortisone (CORTEF) 20 MG tablet Take 10-20 mg by mouth every morning. 20mg  in am, 10mg  in the afternoo 05/09/21  Yes [provider]  insulin glargine (LANTUS) 100 UNIT/ML injection Inject 16 Units into the skin at bedtime.   Yes [provider]  levothyroxine (SYNTHROID) 75 MCG tablet Take 75 mcg by mouth daily before breakfast. 12/31/21  Yes [provider]  metoprolol succinate (TOPROL-XL) 50 MG 24 hr tablet Take 50 mg by mouth daily. 03/16/22  Yes [provider]  ondansetron (ZOFRAN-ODT) 4 MG disintegrating tablet DISSOLVE 1 TAB IN MOUTH EVERY 4 HOURS AS NEEDED FOR NAUSEA AND VOMIT  Patient taking differently: Take 4 mg by mouth every 8 (eight) hours as needed for nausea or vomiting. 03/22/22  Yes Rourk, [name], MD  pantoprazole (PROTONIX) 40 MG tablet TAKE 1 TABLET BY MOUTH TWICE A DAY BEFORE A MEAL Patient taking differently: Take 40 mg by mouth 2 (two) times daily before a meal. 11/10/21  Yes Gelene Mink, NP  tamsulosin (FLOMAX) 0.4 MG CAPS capsule Take 0.4 mg by mouth every other day. 12/26/16  Yes [provider]  traZODone (DESYREL) 50 MG tablet Take 50 mg by mouth at bedtime.  02/05/17  Yes [provider]  apixaban (ELIQUIS) 5 MG TABS tablet Take 5 mg by mouth 2 (two) times daily.    [provider]  Continuous Blood Gluc Receiver (FREESTYLE LIBRE 2 READER) DEVI USE TO TEST BLOOD GLUCOSE LEVELS ONCE A DAY E11.9 11/02/21   [provider]  Continuous Blood Gluc Sensor (FREESTYLE LIBRE 2 SENSOR) MISC TEST BLOOD GLUCOSE LEVEL ONCE A DAY E11.9 11/02/21   [provider]      Allergies    Codeine    Review of Systems   Review of Systems  Constitutional:  Negative for chills and fever.  HENT:  Negative for ear pain and sore throat.   Eyes:  Negative for pain and visual disturbance.  Respiratory:  Negative for cough and shortness of breath.   Cardiovascular:  Negative for chest pain and palpitations.  Gastrointestinal:  Positive for abdominal pain, nausea and vomiting.  Genitourinary:  Negative for dysuria and hematuria.  Musculoskeletal:  Negative for arthralgias and back pain.  Skin:  Negative for color change and rash.  Neurological:  Positive for weakness and headaches. Negative for seizures and syncope.  All other systems reviewed and are negative.   Physical Exam Updated Vital Signs BP (!) 147/79   Pulse 93   Temp 99.1 F (37.3 C) (Oral)   Resp 15   Ht 1.778 m (5\' 10" )   Wt 86.2 kg   SpO2 96%   BMI 27.26 kg/m  Physical Exam Vitals and nursing note reviewed.  Constitutional:      General: He is not in acute distress.    Appearance: Normal appearance. He is well-developed.  HENT:     Head: Normocephalic and atraumatic.     Mouth/Throat:     Mouth: Mucous membranes are dry.  Eyes:     Extraocular Movements: Extraocular movements intact.     Conjunctiva/sclera: Conjunctivae normal.     Pupils: Pupils are equal, round, and reactive to light.  Cardiovascular:     Rate and Rhythm: Regular rhythm. Tachycardia present.     Heart sounds: No murmur heard. Pulmonary:     Effort: Pulmonary effort is normal. No respiratory distress.      Breath sounds: Normal breath sounds.  Abdominal:     Palpations: Abdomen is soft.     Tenderness: There is abdominal tenderness. There is no guarding.     Comments: Mild tenderness diffusely but no guarding  Musculoskeletal:        General: No swelling.     Cervical back: Normal range of motion and neck supple.     Right lower leg: No edema.     Left lower leg: No edema.  Skin:    General: Skin is warm and dry.     Capillary Refill: Capillary refill takes less than 2 seconds.  Neurological:     Mental Status: He is alert. Mental status is at baseline.  Psychiatric:        Mood and Affect: Mood normal.     ED Results / Procedures / Treatments   Labs (all labs ordered are listed, but only abnormal results are displayed) Labs Reviewed  CBC WITH DIFFERENTIAL/PLATELET - Abnormal; Notable for the following components:      Result Value   RBC 3.65 (*)    Hemoglobin 10.4 (*)    HCT 32.5 (*)    Lymphs Abs 0.6 (*)    All other components within normal limits  BASIC METABOLIC PANEL - Abnormal; Notable for the following components:   Glucose, Bld 234 (*)    BUN 25 (*)    Creatinine, Ser 1.48 (*)    Calcium 8.5 (*)    GFR, Estimated 50 (*)    All other components within normal limits  URINALYSIS, ROUTINE W REFLEX MICROSCOPIC - Abnormal; Notable for the following components:   Glucose, UA 50 (*)    Hgb urine dipstick SMALL (*)    Protein, ur 100 (*)    All other components within normal limits  HEPATIC FUNCTION PANEL - Abnormal; Notable for the following components:   Total Protein 6.0 (*)    Albumin 3.0 (*)    All other components within normal limits  CBG MONITORING, ED - Abnormal; Notable for the following components:   Glucose-Capillary 223 (*)    All other components within normal limits  CULTURE, BLOOD (ROUTINE X 2)  CULTURE, BLOOD (ROUTINE X 2)  RESP PANEL BY RT-PCR (RSV, FLU A&B, COVID)  [id number]  LACTIC ACID, PLASMA  LACTIC ACID, PLASMA  LIPASE, BLOOD    EKG EKG  Interpretation Date/Time:  Thursday September 01 2022 08:47:27 EDT Ventricular Rate:  106 PR Interval:  203 QRS Duration:  99 QT Interval:  345 QTC Calculation: 459 R Axis:   64  Text Interpretation: Sinus tachycardia Low voltage, precordial leads Borderline T abnormalities, anterior leads Confirmed by Vanetta Mulders [phone]) on 09/01/2022 9:21:34 AM  Radiology CT CHEST ABDOMEN PELVIS W CONTRAST  Result Date: 09/01/2022 CLINICAL DATA:  Vomiting.  Weakness EXAM: CT CHEST, ABDOMEN, AND PELVIS WITH CONTRAST TECHNIQUE: Multidetector CT imaging of the chest, abdomen and pelvis was performed following the standard protocol during bolus administration of intravenous contrast. RADIATION DOSE REDUCTION: This exam was performed according to the departmental dose-optimization program which includes automated exposure control, adjustment of the mA and/or kV according to patient size and/or use of iterative reconstruction technique. CONTRAST:  OMNIPAQUE IOHEXOL 300 MG/ML  SOLN COMPARISON:  CT angiogram 01/25/2018 FINDINGS: CT CHEST FINDINGS Cardiovascular: Small pericardial effusion. Coronary artery calcifications are seen. The heart is nonenlarged. The thoracic aorta has a normal course and caliber with scattered vascular calcifications. There is a bovine type aortic arch. There is a right IJ chest port in place with the tip extending to the central SVC above the right atrium. Mediastinum/Nodes: No abnormal lymph node enlargement identified in the axillary regions, hilum or mediastinum. Small thyroid gland. Normal caliber thoracic esophagus. Lungs/Pleura: Tiny left pleural effusion. No right-sided pleural effusion. 2-3 mm right upper lobe lung nodule on series 4, image 26 laterally. There is breathing motion throughout the examination. There are mild areas of bronchial wall thickening and interstitial changes, nonspecific and possibly chronic. No consolidation or pneumothorax. Musculoskeletal: Degenerative  changes of the spine. Osteopenia. There are some old left-sided rib fractures identified. However there is a fracture of the anterior aspect of the left sixth rib which may be more acute to subacute. Please correlate  with history. CT ABDOMEN PELVIS FINDINGS Hepatobiliary: No focal liver abnormality is seen. No gallstones, gallbladder wall thickening, or biliary dilatation. Pancreas: Diffuse fatty atrophy of the pancreas.  No mass lesion. Spleen: Normal in size without focal abnormality. Adrenals/Urinary Tract: Adrenal glands are preserved. No enhancing renal mass or collecting system dilatation. Ureters have normal course and caliber dome of the bladder. Bladder has a preserved contour but slight wall thickening. Stomach/Bowel: No oral contrast. There is moderate diffuse colonic stool. Few left-sided colonic diverticula. Appendix is poorly seen in the right lower quadrant but no pericecal stranding or fluid. Small bowel is nondilated. There is a midline anterior abdominal wall small hernia involving loops of small bowel without obstruction. This is above the umbilicus. Stomach is collapsed but there is wall thickening along the stomach with some adjacent vascular engorgement. Please correlate with any symptoms of gastritis or other gastric pathology. Vascular/Lymphatic: Moderate vascular calcifications along the aorta and branch vessels. There are areas of stenosis along the iliac vessels. Normal caliber IVC. No specific abnormal lymph node enlargement identified in the abdomen and pelvis. Reproductive: Enlarged prostate with mass effect along the base of the bladder. Please correlate with the patient's PSA. Other: No free air or free fluid. Musculoskeletal: Scattered degenerative changes of the spine and pelvis. IMPRESSION: Collapsed stomach but wall is somewhat thickened and there is some adjacent vascular engorgement. Please correlate for any particular symptoms including for gastritis or other gastric pathology.  No bowel obstruction, free air or free fluid. Few colonic diverticula. Diffuse stool. Small anterior midline abdominal wall hernia above the umbilicus with involvement of small bowel loop. Again no small bowel obstruction. Tiny left pleural effusion. Chest port. Multiple rib fractures. Many of which are old. There is 1 acute to subacute anterior left-sided rib fracture. Please correlate with symptoms. 3 mm right-sided lung nodule. Although likely benign, if the patient is high-risk, given the morphology and/or location of this nodule a non-contrast chest CT can be considered in 12 months.This recommendation follows the consensus statement: Guidelines for Management of Incidental Pulmonary Nodules Detected on CT Images: From the Fleischner Society 2017; Radiology 2017; 284:228-243. Electronically Signed   By: Karen Kays M.D.   On: 09/01/2022 10:47   CT Head Wo Contrast  Result Date: 09/01/2022 CLINICAL DATA:  Headache, neuro deficit EXAM: CT HEAD WITHOUT CONTRAST TECHNIQUE: Contiguous axial images were obtained from the base of the skull through the vertex without intravenous contrast. RADIATION DOSE REDUCTION: This exam was performed according to the departmental dose-optimization program which includes automated exposure control, adjustment of the mA and/or kV according to patient size and/or use of iterative reconstruction technique. COMPARISON:  None Available. FINDINGS: Brain: No hemorrhage. No hydrocephalus. No CT evidence of an acute cortical infarct. Sequela of moderate chronic microvascular ischemic change with chronic small-vessel infarcts in the left basal ganglia and thalamocapsular regions bilaterally. In the. Generalized volume loss left middle cranial fossa arachnoid cyst. Vascular: No hyperdense vessel or unexpected calcification. Skull: Normal. Negative for fracture or focal lesion. Sinuses/Orbits: No middle ear or mastoid effusion. Mucosal thickening right maxillary sinus. Orbits are  unremarkable. Other: None. IMPRESSION: 1. No acute intracranial process. 2. Sequela of moderate chronic microvascular ischemic change with chronic small-vessel infarcts in the left basal ganglia and thalamocapsular regions bilaterally Electronically Signed   By: Lorenza Cambridge M.D.   On: 09/01/2022 10:39   DG Chest Port 1 View  Result Date: 09/01/2022 CLINICAL DATA:  09381 Infection 82993 EXAM: PORTABLE CHEST 1 VIEW COMPARISON:  None Available. FINDINGS: Right-sided chest port terminates at the superior cavoatrial junction. Overlying loop recorder. Mild cardiomegaly. Aortic atherosclerosis. Minimal streaky bibasilar opacities. No pleural effusion or pneumothorax. IMPRESSION: Minimal streaky bibasilar opacities, atelectasis versus infiltrate. Electronically Signed   By: Duanne Guess D.O.   On: 09/01/2022 09:51    Procedures Procedures    Medications Ordered in ED Medications  0.9 %  sodium chloride infusion (0 mLs Intravenous Hold 09/01/22 0939)  pantoprazole (PROTONIX) injection 40 mg (40 mg Intravenous Given 09/01/22 1521)  sodium chloride 0.9 % bolus 1,000 mL (0 mLs Intravenous Stopped 09/01/22 1516)  iohexol (OMNIPAQUE) 300 MG/ML solution 100 mL (100 mLs Intravenous Contrast Given 09/01/22 1007)    ED Course/ Medical Decision Making/ A&P                             Medical Decision Making Amount and/or Complexity of Data Reviewed Labs: ordered. Radiology: ordered.  Risk Prescription drug management. Decision regarding hospitalization.   Patient without any further vomiting here.  Initially we had some concerns about sepsis but lactic acid was not elevated.  Respiratory panel is negative.  Blood sugar was good at 223.  CBC white count 5 hemoglobin 10.4 platelets 202.  Hepatic panel albumin a little low at 3.0 but LFTs normal.  Lipase not elevated.  Patient's GFR was 50.  Creatinine was 1.48 a little bit worse than usual consistent with a little bit of acute kidney injury and  dehydration.  Patient will be given IV fluids for this.  Blood cultures x 2 were sent  CT head was done to rule out any evidence of bleed and that was negative for anything acute.  Some evidence of remote infarcts.  CT abdomen and chest showed multiple rib fractures many which are old there is a questionable 1 that is acute or subacute left sided rib fracture please correlate with symptoms.  Patient does not seem to have any pain there.  3 mm right-sided nodule though likely benign in patient will need close follow-up with this.  Regarding the abdominal part is collapsed stomach somewhat thickened no evidence of bowel obstruction.  Small anterior midline abdominal wall hernia no bowel involvement no evidence of obstruction.  Tiny left pleural effusion.  Based on this contacted Dr. Levon Hedger covering for GI and he is recommending admission that they will probably scope him tomorrow.  Did not recommend anything for the vomiting appears this may be gastroparesis.  But for some reason Dr. Jena Gauss had recommended patient not take Reglan.  Contacted with hospitalist who will admit.   Final Clinical Impression(s) / ED Diagnoses Final diagnoses:  Dehydration  Bilious vomiting, unspecified whether nausea present  Acute kidney injury [facility])    Rx / DC Orders ED Discharge Orders     None         Vanetta Mulders, MD 09/01/22 1531

## 2022-09-01 NOTE — ED Notes (Signed)
Both sets of blood cultures drawn before any antibiotic administration  

## 2022-09-01 NOTE — H&P (Addendum)
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  Notably, the patient has been struggling with early satiety with nausea and vomiting for the better part of 3 months.  He last saw GI, Dr. Jena Gauss on 08/19/2022 with plans for upcoming EGD. However, in the last 24 to 48 hours he has had increased frequency of his bilious vomiting, up to 4 episodes in the last 24 hours.  There is no hematemesis.  The patient has had increasing generalized weakness in the past 24 hours.  As result he presented to the emergency department for further evaluation.  The patient's spouse at the bedside supplements the history.  Notably, she has noted some low-grade temperatures from 99.0 F to 100.0 F in the past 24 hours.  The patient has had a frontal headache.  He denies any neck pain.  He denies any chest pain, shortness breath, coughing, hemoptysis, abdominal pain, dysuria, hematuria.  At baseline, the patient is able to ambulate with the assistance of a rollator.  He is able to make transfers.  However in the last 24 hours he has had increasing generalized weakness with difficulty even getting out of bed.  Wife states that she doubled up his dose of hydrocortisone on 08/31/2022.  She also doubled up on his morning dose of hydrocortisone on the morning of 09/01/2022.  His last dose of apixaban was on the evening of  08/30/2022.  Notably, the patient had a recent hospital admission at [facility] from 05/04/2022 to 05/13/2022.  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 coli and Clostridium bacteremia.  He was also treated for bilateral aspiration pneumonia.  He was subsequently discharged home on his baseline dose of hydrocortisone, 20 mg in the morning, 10 mg in the evening.  In the emergency department, the patient was afebrile hemodynamically stable.  He did have low-grade temperature 99.3 F.  He was mildly tachycardic HR 105-110.  He was hemodynamically stable.  Oxygen saturation 95% room air.  CT of the chest showed tiny left pleural effusion.  There was a 2-3 mm RUL nodule.  There are mild areas of bronchial wall thickening and interstitial changes.  There is no consolidation or pneumothorax.  CT of the abdomen and pelvis was negative for any gallbladder wall thickening.  There was moderate diffuse colonic stool with scattered diverticula.  Small bowel is nondilated.  Stomach is collapsed but with wall thickening and some adjacent vascular engorgement.  There is prostamegaly.  WBC 5.0, hemoglobin 10.4, platelets 204,000.  Sodium 137, potassium 3.6, bicarbonate 27, serum creatinine 1.48.  AST 14, ALT 14, alk phosphatase 76, total bilirubin 0.6, lipase 23. The patient was started on IV fluids.  GI was consulted to assist with management.  Review of Systems: As mentioned in the history of present illness. All other systems reviewed and are negative. Past Medical History:  Diagnosis Date   A-fib (HCC)  Adrenal insufficiency (Addison's disease) (HCC)    Benign prostate hyperplasia    Diabetes (HCC)    Elevated PSA    GERD (gastroesophageal reflux disease)    History of kidney stones    Hypercholesterolemia    Hypertension    Impotence of organic origin    Kidney stone    PAD (peripheral artery disease) (HCC)    Peripheral artery disease  (HCC)    s/p stenting   Skin cancer    on nose   Stroke (cerebrum) (HCC)    Stroke (HCC)    2013 and 2017; 2019   Urolithiasis    Past Surgical History:  Procedure Laterality Date   BACK SURGERY  05/2016   BIOPSY  12/28/2017   Procedure: BIOPSY;  Surgeon: Corbin Ade, MD;  Location: [facility];  Service: Endoscopy;;  gastric bx's   COLONOSCOPY WITH PROPOFOL N/A 11/20/2017   Dr. Jena Gauss: Diverticulosis,two 5 to 30 mm polyps in the cecum and rectum.  Cecal polypectomy site tattooed.  Pathology revealed tubular adenoma, no high-grade dysplasia.  Recommend colonoscopy in 6 months.   COLONOSCOPY WITH PROPOFOL N/A 09/03/2018   sigmoid and descending colon diverticulosis, two 2-5 mm polyps in ascending colon and cecum, s/p removal. Polypoid mucosa and tubular adenoma. 5 year surveillance.    ESOPHAGOGASTRODUODENOSCOPY (EGD) WITH PROPOFOL N/A 12/28/2017   Dr. Jena Gauss: Normal esophagus, erosive gastropathy, duodenal erosions without bleeding.  Gastric biopsy benign.  No H. pylori on biopsy.   HERNIA REPAIR     umbilical   LOOP RECORDER INSERTION  06/27/2017   PERCUTANEOUS STENT INTERVENTION Left    Left leg for PAD   POLYPECTOMY  11/20/2017   Procedure: POLYPECTOMY;  Surgeon: Corbin Ade, MD;  Location: [facility];  Service: Endoscopy;;  cecal   POLYPECTOMY  09/03/2018   Procedure: POLYPECTOMY;  Surgeon: Corbin Ade, MD;  Location: [facility];  Service: Endoscopy;;  colon   SHOULDER SURGERY Right    torn rotator cuff   Social History:  reports that he quit smoking about 4 years ago. His smoking use included cigarettes. He started smoking about 53 years ago. He has a 12.00 pack-year smoking history. He has never used smokeless tobacco. He reports that he does not drink alcohol and does not use drugs.  Allergies  Allergen Reactions   Codeine Itching    Family History  Problem Relation Age of Onset   Heart attack Mother    Diabetes Father    Colon cancer Neg Hx    Gastric  cancer Neg Hx    Esophageal cancer Neg Hx     Prior to Admission medications   Medication Sig Start Date End Date Taking? Authorizing Provider  amLODipine (NORVASC) 10 MG tablet Take 10 mg by mouth daily.   Yes [provider]  apixaban (ELIQUIS) 5 MG TABS tablet Take 5 mg by mouth 2 (two) times daily.   Yes [provider]  hydrocortisone (CORTEF) 10 MG tablet Take 20 mg by mouth every evening. 06/01/21  Yes [provider]  levothyroxine (SYNTHROID) 75 MCG tablet Take 75 mcg by mouth daily before breakfast. 12/31/21  Yes [provider]  aspirin 81 MG tablet Take 81 mg by mouth at bedtime.    [provider]  atorvastatin (LIPITOR) 80 MG tablet Take 80 mg by mouth every Monday, Wednesday, and Friday. 10/13/15   [provider]  cetirizine (ZYRTEC) 10 MG tablet Take 10 mg by mouth daily as needed for allergies.  [provider]  Continuous Blood Gluc Receiver (FREESTYLE LIBRE 2 READER) DEVI USE TO TEST BLOOD GLUCOSE LEVELS ONCE A DAY E11.9 11/02/21   [provider]  Continuous Blood Gluc Sensor (FREESTYLE LIBRE 2 SENSOR) MISC TEST BLOOD GLUCOSE LEVEL ONCE A DAY E11.9 11/02/21   [provider]  escitalopram (LEXAPRO) 10 MG tablet Take 30 mg by mouth daily with breakfast. 12/05/19   [provider]  finasteride (PROSCAR) 5 MG tablet Take 5 mg by mouth at bedtime. 12/31/21   [provider]  HUMALOG KWIKPEN 100 UNIT/ML KwikPen Inject 18-20 Units into the skin See admin instructions. Inject 20 units under the skin in the morning with breakfast and inject 18 units with lunch 07/19/21   [provider]  hydrocortisone (CORTEF) 20 MG tablet Take 20 mg by mouth every morning. 05/09/21   [provider]  insulin glargine (LANTUS) 100 UNIT/ML injection Inject 16 Units into the skin at bedtime.    [provider]  metoprolol succinate (TOPROL-XL) 50 MG 24 hr tablet Take 50 mg by mouth  daily. 03/16/22   [provider]  ondansetron (ZOFRAN-ODT) 4 MG disintegrating tablet DISSOLVE 1 TAB IN MOUTH EVERY 4 HOURS AS NEEDED FOR NAUSEA AND VOMIT 03/22/22   Rourk, [name], MD  pantoprazole (PROTONIX) 40 MG tablet TAKE 1 TABLET BY MOUTH TWICE A DAY BEFORE A MEAL 11/10/21   Gelene Mink, NP  tamsulosin (FLOMAX) 0.4 MG CAPS capsule Take 0.4 mg by mouth at bedtime. 12/26/16   [provider]  traZODone (DESYREL) 50 MG tablet Take 50 mg by mouth at bedtime.  02/05/17   [provider]    Physical Exam: Vitals:   09/01/22 1155 09/01/22 1200 09/01/22 1230 09/01/22 1330  BP:  (!) 150/75 (!) 146/76 (!) 148/77  Pulse:  99 99 100  Resp:  15 17 17   Temp: 99.1 F (37.3 C)     TempSrc: Oral     SpO2:  97% 95% 97%  Weight:      Height:       GENERAL:  A&O x 3, NAD, well developed, cooperative, follows commands HEENT: [place]/AT, No thrush, No icterus, No oral ulcers Neck:  No neck mass, No meningismus, soft, supple CV: RRR, no S3, no S4, no rub, no JVD Lungs: Bibasilar crackles.  No wheezing. Abd: soft/NT +BS, nondistended Ext: No edema, no lymphangitis, no cyanosis, no rashes Neuro:  CN II-XII intact, strength 4/5 in RUE, RLE, strength 4/5 LUE, LLE; sensation intact bilateral; no dysmetria; babinski equivocal  Data Reviewed: Data reviewed above in the history Assessment and Plan: Intractable nausea and vomiting -multifactorial including a degree of relative adrenal insufficiency, gastroparesis and possible gastritis/esophagitis/esophageal dysmotility -Start Zofran around-the-clock -Clear liquid diet -Start pantoprazole -GI consult -Plans noted for possible EGD 08/25/2022  CKD stage IIIa -Baseline creatinine 1.5-1.7 -Monitor BMP  Generalized weakness -Multifactorial including dehydration, acute medical illness, deconditioning -The patient has had a decline in his functional status over the past 1-1/2 years -PT evaluations when  stabilized -B12 -TSH -Folate  Adrenal insufficiency -Plan to give the patient IV hydrocortisone stress dosing until able to tolerate p.o.  Non-small cell lung cancer -Recent imaging shows no signs of active malignancy -Keytruda currently on hold per oncologist Dr. Veronia Beets  Diabetes mellitus type 2 with hyperglycemia -06/16/2022 hemoglobin A1c 8.0 -At baseline, patient takes Lantus 18 units at bedtime and lispro 20-20-20 with 2 units for every 50 points above 200  History of embolic stroke -Continue apixaban  Mixed hyperlipidemia -Continue statin  Hypothyroidism -Continue Synthroid   Advance Care Planning: FULL CODE  Consults: GI  Family Communication: wife updatd 6/27  Severity of Illness: The appropriate patient status for this patient is OBSERVATION. Observation status is judged to be reasonable and necessary in order to provide the required intensity of service to ensure the patient's safety. The patient's presenting symptoms, physical exam findings, and initial radiographic and laboratory data in the context of their medical condition is felt to place them at decreased risk for further clinical deterioration. Furthermore, it is anticipated that the patient will be medically stable for discharge from the hospital within 2 midnights of admission.   Author: Catarina Hartshorn, MD 09/01/2022 2:14 PM  For on call review [email or web address].

## 2022-09-01 NOTE — Consult Note (Addendum)
  Gastroenterology Consult   Referring Provider: No ref. provider found Primary Care Physician:  McGee, Rachel, DO Primary Gastroenterologist:  Chason Rourk, MD   Patient ID: Jack Mcbride; [id number]; 05/02/1950   Admit date: 09/01/2022  LOS: 0 days   Date of Consultation: 09/01/2022  Reason for Consultation:  n/v    History of Present Illness   Jack Mcbride is a 71 y.o. male with history of diabetes, gastroparesis, adrenal insufficiency, history of ischemic colitis while in [place] last year, GERD, hypertension, PAD, PVD status post stenting, history of embolic strokes, A-fib, BPH, kidney stones, loop recorder insertion, history of non-small cell lung cancer just came off Keytruda several weeks ago who presented to the emergency department due to low-grade fever, weakness, abdominal pain, nausea/vomiting.  Seen in the office recently on June 14 with recurrent early satiety, nausea/vomiting.  3-month history of early satiety, nausea with intermittent vomiting.  Symptoms had been well-controlled on PPI twice daily taking Zofran as needed.  Vocal cord surgery at [facility] earlier this year, was not given stress doses of corticosteroids and had adrenal crisis which put him in the hospital for 4 days.  He continues to take hydrocortisone.  Previously it was felt that he is not a great candidate for Reglan therapy in the future given his prior 8 strokes and limited mobility and baseline tremors.   Patient was on vacation last week and had a lot of nausea.  At baseline he takes Zofran every morning at 8 AM.  Sometimes he takes additional doses.  Over the past few days has had some issues with constipation, has had a BM after taking MiraLAX.  Typically takes MiraLAX as needed.  Take stool softeners daily.  Yesterday he woke up with nausea/vomiting and low-grade temperature of 100.5.  Nonbloody emesis, yellow/green in color.  Noted weakness, difficulty ambulating.  Increased tremors.  Had  changed diabetic sensor earlier, not sure if working appropriately, Reading low sugar with glucose of 55.  Ate to try to get his sugar up, finger prick with glucose of 188.  Also complaining of lower abdominal pain associated with this illness.  No cough.  No ill contacts.  Denies dysuria.  Denies heartburn, dysphagia, melena, rectal bleeding.  Scheduled for EGD as an outpatient tomorrow.  Last dose of Eliquis was Tuesday evening.  Missed preop appointment today due to illness.  In the ED: Lactic acid 1.3, white blood cell count 5000, hemoglobin 10.4, creatinine 1.48, albumin 3, LFTs normal, respiratory panel negative, urinalysis with small blood, glucose 50, protein 100.  CT chest/abdomen/pelvis with contrast, see full report below.  Collapse stomach but wall is somewhat thickened and there is some adjacent vascular engorgement, small anterior midline abdominal wall hernia above the umbilicus with involvement of small bowel loop but no obstruction, 1 acute to subacute anterior left-sided rib fracture, chest port noted, 3 mm right-sided lung nodule.  GES May 2021: Minimally delayed gastric emptying, normal at hours 1, 2, 3 but 86% emptied at 4 hours with normal being greater than or equal to 90%.  Colonoscopy June 2020: -Diverticulosis -two 2 to 5 mm polyps in the ascending colon and in the cecum, one was tubular adenoma -1 more colonoscopy in 5 years  EGD October 2019: -Normal esophagus -Erosive gastropathy -Duodenal erosions without bleeding -Gastric biopsy negative for H. pylori  Prior to Admission medications   Medication Sig Start Date End Date Taking? Authorizing Provider  amLODipine (NORVASC) 10 MG tablet Take 10 mg by mouth   daily.    [provider]  apixaban (ELIQUIS) 5 MG TABS tablet Take 5 mg by mouth 2 (two) times daily.    [provider]  aspirin 81 MG tablet Take 81 mg by mouth at bedtime.    [provider]  atorvastatin (LIPITOR) 80 MG tablet Take  80 mg by mouth every Monday, Wednesday, and Friday. 10/13/15   [provider]  cetirizine (ZYRTEC) 10 MG tablet Take 10 mg by mouth daily as needed for allergies.    [provider]  Continuous Blood Gluc Receiver (FREESTYLE LIBRE 2 READER) DEVI USE TO TEST BLOOD GLUCOSE LEVELS ONCE A DAY E11.9 11/02/21   [provider]  Continuous Blood Gluc Sensor (FREESTYLE LIBRE 2 SENSOR) MISC TEST BLOOD GLUCOSE LEVEL ONCE A DAY E11.9 11/02/21   [provider]  escitalopram (LEXAPRO) 10 MG tablet Take 30 mg by mouth daily with breakfast. 12/05/19   [provider]  finasteride (PROSCAR) 5 MG tablet Take 5 mg by mouth at bedtime. 12/31/21   [provider]  HUMALOG KWIKPEN 100 UNIT/ML KwikPen Inject 18-20 Units into the skin See admin instructions. Inject 20 units under the skin in the morning with breakfast and inject 18 units with lunch 07/19/21   [provider]  hydrocortisone (CORTEF) 10 MG tablet Take 10 mg by mouth every evening. 06/01/21   [provider]  hydrocortisone (CORTEF) 20 MG tablet Take 20 mg by mouth every morning. 05/09/21   [provider]  insulin glargine (LANTUS) 100 UNIT/ML injection Inject 16 Units into the skin at bedtime.    [provider]  levothyroxine (SYNTHROID) 75 MCG tablet Take 75 mcg by mouth daily before breakfast. 12/31/21   [provider]  metoprolol succinate (TOPROL-XL) 50 MG 24 hr tablet Take 50 mg by mouth daily. 03/16/22   [provider]  ondansetron (ZOFRAN-ODT) 4 MG disintegrating tablet DISSOLVE 1 TAB IN MOUTH EVERY 4 HOURS AS NEEDED FOR NAUSEA AND VOMIT 03/22/22   Rourk, Robert M, MD  pantoprazole (PROTONIX) 40 MG tablet TAKE 1 TABLET BY MOUTH TWICE A DAY BEFORE A MEAL 11/10/21   Boone, Anna W, NP  tamsulosin (FLOMAX) 0.4 MG CAPS capsule Take 0.4 mg by mouth at bedtime. 12/26/16   [provider]  traZODone (DESYREL) 50 MG tablet Take 50 mg by mouth at  bedtime.  02/05/17   [provider]    Current Facility-Administered Medications  Medication Dose Route Frequency Provider Last Rate Last Admin   0.9 %  sodium chloride infusion   Intravenous Continuous Zackowski, Scott, MD   Held at 09/01/22 0939   Current Outpatient Medications  Medication Sig Dispense Refill   amLODipine (NORVASC) 10 MG tablet Take 10 mg by mouth daily.     apixaban (ELIQUIS) 5 MG TABS tablet Take 5 mg by mouth 2 (two) times daily.     aspirin 81 MG tablet Take 81 mg by mouth at bedtime.     atorvastatin (LIPITOR) 80 MG tablet Take 80 mg by mouth every Monday, Wednesday, and Friday.     cetirizine (ZYRTEC) 10 MG tablet Take 10 mg by mouth daily as needed for allergies.     Continuous Blood Gluc Receiver (FREESTYLE LIBRE 2 READER) DEVI USE TO TEST BLOOD GLUCOSE LEVELS ONCE A DAY E11.9     Continuous Blood Gluc Sensor (FREESTYLE LIBRE 2 SENSOR) MISC TEST BLOOD GLUCOSE LEVEL ONCE A DAY E11.9     escitalopram (LEXAPRO) 10 MG tablet Take 30 mg   by mouth daily with breakfast.     finasteride (PROSCAR) 5 MG tablet Take 5 mg by mouth at bedtime.     HUMALOG KWIKPEN 100 UNIT/ML KwikPen Inject 18-20 Units into the skin See admin instructions. Inject 20 units under the skin in the morning with breakfast and inject 18 units with lunch     hydrocortisone (CORTEF) 10 MG tablet Take 10 mg by mouth every evening.     hydrocortisone (CORTEF) 20 MG tablet Take 20 mg by mouth every morning.     insulin glargine (LANTUS) 100 UNIT/ML injection Inject 16 Units into the skin at bedtime.     levothyroxine (SYNTHROID) 75 MCG tablet Take 75 mcg by mouth daily before breakfast.     metoprolol succinate (TOPROL-XL) 50 MG 24 hr tablet Take 50 mg by mouth daily.     ondansetron (ZOFRAN-ODT) 4 MG disintegrating tablet DISSOLVE 1 TAB IN MOUTH EVERY 4 HOURS AS NEEDED FOR NAUSEA AND VOMIT 30 tablet 1   pantoprazole (PROTONIX) 40 MG tablet TAKE 1 TABLET BY MOUTH TWICE A DAY BEFORE A MEAL 180 tablet  3   tamsulosin (FLOMAX) 0.4 MG CAPS capsule Take 0.4 mg by mouth at bedtime.  3   traZODone (DESYREL) 50 MG tablet Take 50 mg by mouth at bedtime.   1    Allergies as of 09/01/2022 - Review Complete 09/01/2022  Allergen Reaction Noted   Codeine Itching 09/05/2011    Past Medical History:  Diagnosis Date   A-fib (HCC)    Adrenal insufficiency (Addison's disease) (HCC)    Benign prostate hyperplasia    Diabetes (HCC)    Elevated PSA    GERD (gastroesophageal reflux disease)    History of kidney stones    Hypercholesterolemia    Hypertension    Impotence of organic origin    Kidney stone    PAD (peripheral artery disease) (HCC)    Peripheral artery disease (HCC)    s/p stenting   Skin cancer    on nose   Stroke (cerebrum) (HCC)    Stroke (HCC)    2013 and 2017; 2019   Urolithiasis     Past Surgical History:  Procedure Laterality Date   BACK SURGERY  05/2016   BIOPSY  12/28/2017   Procedure: BIOPSY;  Surgeon: Rourk, Robert M, MD;  Location: [facility];  Service: Endoscopy;;  gastric bx's   COLONOSCOPY WITH PROPOFOL N/A 11/20/2017   Dr. Rourk: Diverticulosis,two 5 to 30 mm polyps in the cecum and rectum.  Cecal polypectomy site tattooed.  Pathology revealed tubular adenoma, no high-grade dysplasia.  Recommend colonoscopy in 6 months.   COLONOSCOPY WITH PROPOFOL N/A 09/03/2018   sigmoid and descending colon diverticulosis, two 2-5 mm polyps in ascending colon and cecum, s/p removal. Polypoid mucosa and tubular adenoma. 5 year surveillance.    ESOPHAGOGASTRODUODENOSCOPY (EGD) WITH PROPOFOL N/A 12/28/2017   Dr. Rourk: Normal esophagus, erosive gastropathy, duodenal erosions without bleeding.  Gastric biopsy benign.  No H. pylori on biopsy.   HERNIA REPAIR     umbilical   LOOP RECORDER INSERTION  06/27/2017   PERCUTANEOUS STENT INTERVENTION Left    Left leg for PAD   POLYPECTOMY  11/20/2017   Procedure: POLYPECTOMY;  Surgeon: Rourk, Robert M, MD;  Location: [facility];   Service: Endoscopy;;  cecal   POLYPECTOMY  09/03/2018   Procedure: POLYPECTOMY;  Surgeon: Rourk, Robert M, MD;  Location: [facility];  Service: Endoscopy;;  colon   SHOULDER SURGERY Right      torn rotator cuff    Family History  Problem Relation Age of Onset   Heart attack Mother    Diabetes Father    Colon cancer Neg Hx    Gastric cancer Neg Hx    Esophageal cancer Neg Hx     Social History   Socioeconomic History   Marital status: Married    Spouse name: Not on file   Number of children: Not on file   Years of education: Not on file   Highest education level: Not on file  Occupational History   Not on file  Tobacco Use   Smoking status: Former    Packs/day: 0.25    Years: 48.00    Additional pack years: 0.00    Total pack years: 12.00    Types: Cigarettes    Start date: 03/07/1969    Quit date: 04/03/2018    Years since quitting: 4.4   Smokeless tobacco: Never  Vaping Use   Vaping Use: Never used  Substance and Sexual Activity   Alcohol use: No   Drug use: No   Sexual activity: Yes    Birth control/protection: None  Other Topics Concern   Not on file  Social History Narrative   Not on file   Social Determinants of Health   Financial Resource Strain: Not on file  Food Insecurity: Not on file  Transportation Needs: Not on file  Physical Activity: Not on file  Stress: Not on file  Social Connections: Not on file  Intimate Partner Violence: Not on file     Review of System:   General: Negative for anorexia, weight loss, fever, chills, fatigue, +weakness. Eyes: Negative for vision changes.  ENT: Negative for hoarseness, difficulty swallowing , nasal congestion. CV: Negative for chest pain, angina, palpitations, dyspnea on exertion, peripheral edema.  Respiratory: Negative for dyspnea at rest, dyspnea on exertion, cough, sputum, wheezing.  GI: See history of present illness. GU:  Negative for dysuria, hematuria, urinary incontinence, urinary frequency,  nocturnal urination.  MS: Negative for joint pain, low back pain.  Derm: Negative for rash or itching.  Neuro: Negative for   seizure, frequent headaches, memory loss, confusion. Left sided weakness.  Psych: Negative for anxiety, depression, suicidal ideation, hallucinations.  Endo: Negative for unusual weight change.  Heme: Negative for bruising or bleeding. Allergy: Negative for rash or hives.      Physical Examination:   Vital signs in last 24 hours: Temp:  [99.1 F (37.3 C)-99.3 F (37.4 C)] 99.1 F (37.3 C) (06/27 1155) Pulse Rate:  [99-104] 100 (06/27 1330) Resp:  [15-22] 17 (06/27 1330) BP: (139-159)/(74-86) 148/77 (06/27 1330) SpO2:  [94 %-97 %] 97 % (06/27 1330) Weight:  [86.2 kg] 86.2 kg (06/27 0843)    General: Well-nourished, well-developed in no acute distress.  Head: Normocephalic, atraumatic.   Eyes: Conjunctiva pink, no icterus. Mouth: Oropharyngeal mucosa moist and pink  Neck: Supple without thyromegaly, masses, or lymphadenopathy.  Lungs: Clear to auscultation bilaterally.  Heart: Regular rate and rhythm, no murmurs rubs or gallops.  Abdomen: Bowel sounds are normal, nontender, nondistended, no hepatosplenomegaly or masses, no abdominal bruits or hernia , no rebound or guarding.   Rectal: not performed Extremities: No lower extremity edema, clubbing, deformity.  Neuro: Alert and oriented x 4 ,   Skin: Warm and dry, no rash or jaundice.   Psych: Alert and cooperative, normal mood and affect.        Intake/Output from previous day: No intake/output data recorded. Intake/Output this shift:   No intake/output data recorded.  Lab Results:   CBC Recent Labs    09/01/22 0856  WBC 5.0  HGB 10.4*  HCT 32.5*  MCV 89.0  PLT 202   BMET Recent Labs    09/01/22 0856  NA 137  K 3.6  CL 102  CO2 27  GLUCOSE 234*  BUN 25*  CREATININE 1.48*  CALCIUM 8.5*   LFT Recent Labs    09/01/22 0856  BILITOT 0.6  BILIDIR 0.1  IBILI 0.5  ALKPHOS 76  AST 16   ALT 14  PROT 6.0*  ALBUMIN 3.0*    Lipase Recent Labs    09/01/22 0856  LIPASE 23    PT/INR No results for input(s): "LABPROT", "INR" in the last 72 hours.   Hepatitis Panel No results for input(s): "HEPBSAG", "HCVAB", "HEPAIGM", "HEPBIGM" in the last 72 hours.   Imaging Studies:   CT CHEST ABDOMEN PELVIS W CONTRAST  Result Date: 09/01/2022 CLINICAL DATA:  Vomiting.  Weakness EXAM: CT CHEST, ABDOMEN, AND PELVIS WITH CONTRAST TECHNIQUE: Multidetector CT imaging of the chest, abdomen and pelvis was performed following the standard protocol during bolus administration of intravenous contrast. RADIATION DOSE REDUCTION: This exam was performed according to the departmental dose-optimization program which includes automated exposure control, adjustment of the mA and/or kV according to patient size and/or use of iterative reconstruction technique. CONTRAST:  100mL OMNIPAQUE IOHEXOL 300 MG/ML  SOLN COMPARISON:  CT angiogram 01/25/2018 FINDINGS: CT CHEST FINDINGS Cardiovascular: Small pericardial effusion. Coronary artery calcifications are seen. The heart is nonenlarged. The thoracic aorta has a normal course and caliber with scattered vascular calcifications. There is a bovine type aortic arch. There is a right IJ chest port in place with the tip extending to the central SVC above the right atrium. Mediastinum/Nodes: No abnormal lymph node enlargement identified in the axillary regions, hilum or mediastinum. Small thyroid gland. Normal caliber thoracic esophagus. Lungs/Pleura: Tiny left pleural effusion. No right-sided pleural effusion. 2-3 mm right upper lobe lung nodule on series 4, image 26 laterally. There is breathing motion throughout the examination. There are mild areas of bronchial wall thickening and interstitial changes, nonspecific and possibly chronic. No consolidation or pneumothorax. Musculoskeletal: Degenerative changes of the spine. Osteopenia. There are some old left-sided rib  fractures identified. However there is a fracture of the anterior aspect of the left sixth rib which may be more acute to subacute. Please correlate with history. CT ABDOMEN PELVIS FINDINGS Hepatobiliary: No focal liver abnormality is seen. No gallstones, gallbladder wall thickening, or biliary dilatation. Pancreas: Diffuse fatty atrophy of the pancreas.  No mass lesion. Spleen: Normal in size without focal abnormality. Adrenals/Urinary Tract: Adrenal glands are preserved. No enhancing renal mass or collecting system dilatation. Ureters have normal course and caliber dome of the bladder. Bladder has a preserved contour but slight wall thickening. Stomach/Bowel: No oral contrast. There is moderate diffuse colonic stool. Few left-sided colonic diverticula. Appendix is poorly seen in the right lower quadrant but no pericecal stranding or fluid. Small bowel is nondilated. There is a midline anterior abdominal wall small hernia involving loops of small bowel without obstruction. This is above the umbilicus. Stomach is collapsed but there is wall thickening along the stomach with some adjacent vascular engorgement. Please correlate with any symptoms of gastritis or other gastric pathology. Vascular/Lymphatic: Moderate vascular calcifications along the aorta and branch vessels. There are areas of stenosis along the iliac vessels. Normal caliber IVC. No specific abnormal lymph node enlargement identified in the abdomen and pelvis.   Reproductive: Enlarged prostate with mass effect along the base of the bladder. Please correlate with the patient's PSA. Other: No free air or free fluid. Musculoskeletal: Scattered degenerative changes of the spine and pelvis. IMPRESSION: Collapsed stomach but wall is somewhat thickened and there is some adjacent vascular engorgement. Please correlate for any particular symptoms including for gastritis or other gastric pathology. No bowel obstruction, free air or free fluid. Few colonic  diverticula. Diffuse stool. Small anterior midline abdominal wall hernia above the umbilicus with involvement of small bowel loop. Again no small bowel obstruction. Tiny left pleural effusion. Chest port. Multiple rib fractures. Many of which are old. There is 1 acute to subacute anterior left-sided rib fracture. Please correlate with symptoms. 3 mm right-sided lung nodule. Although likely benign, if the patient is high-risk, given the morphology and/or location of this nodule a non-contrast chest CT can be considered in 12 months.This recommendation follows the consensus statement: Guidelines for Management of Incidental Pulmonary Nodules Detected on CT Images: From the Fleischner Society 2017; Radiology 2017; 284:228-243. Electronically Signed   By: Ashok  Gupta M.D.   On: 09/01/2022 10:47   CT Head Wo Contrast  Result Date: 09/01/2022 CLINICAL DATA:  Headache, neuro deficit EXAM: CT HEAD WITHOUT CONTRAST TECHNIQUE: Contiguous axial images were obtained from the base of the skull through the vertex without intravenous contrast. RADIATION DOSE REDUCTION: This exam was performed according to the departmental dose-optimization program which includes automated exposure control, adjustment of the mA and/or kV according to patient size and/or use of iterative reconstruction technique. COMPARISON:  None Available. FINDINGS: Brain: No hemorrhage. No hydrocephalus. No CT evidence of an acute cortical infarct. Sequela of moderate chronic microvascular ischemic change with chronic small-vessel infarcts in the left basal ganglia and thalamocapsular regions bilaterally. In the. Generalized volume loss left middle cranial fossa arachnoid cyst. Vascular: No hyperdense vessel or unexpected calcification. Skull: Normal. Negative for fracture or focal lesion. Sinuses/Orbits: No middle ear or mastoid effusion. Mucosal thickening right maxillary sinus. Orbits are unremarkable. Other: None. IMPRESSION: 1. No acute intracranial  process. 2. Sequela of moderate chronic microvascular ischemic change with chronic small-vessel infarcts in the left basal ganglia and thalamocapsular regions bilaterally Electronically Signed   By: Hemant  Desai M.D.   On: 09/01/2022 10:39   DG Chest Port 1 View  Result Date: 09/01/2022 CLINICAL DATA:  [id number] Infection [id number] EXAM: PORTABLE CHEST 1 VIEW COMPARISON:  None Available. FINDINGS: Right-sided chest port terminates at the superior cavoatrial junction. Overlying loop recorder. Mild cardiomegaly. Aortic atherosclerosis. Minimal streaky bibasilar opacities. No pleural effusion or pneumothorax. IMPRESSION: Minimal streaky bibasilar opacities, atelectasis versus infiltrate. Electronically Signed   By: Nicholas  Plundo D.O.   On: 09/01/2022 09:51  [4 week]  Assessment:   71-year-old male with multiple comorbidities including history of diabetes, gastroparesis, adrenal insufficiency, history of ischemic colitis, GERD, hypertension, PAD, PVD status post stenting, history of 8 strokes, A-fib, BPH, kidney stones, loop recorder insertion, history of non-small cell lung cancer just came off Keytruda several weeks ago who presented to the emergency department due to low-grade fever, weakness, abdominal pain, nausea/vomiting.  GI consulted for vomiting.  Nausea/vomiting/abdominal pain: Chronic intermittent episodes of nausea/vomiting for the past several years.  History of mild delayed gastric emptying study as outlined above.  Was supposed to have an outpatient EGD tomorrow, last dose of Eliquis Tuesday evening.  Woke up with acute onset nausea/vomiting yesterday.  No hematemesis.  No melena or rectal bleeding.  Complains of vague lower abdominal pain associated   with symptoms.  Noted increased nausea last week following vacation.  Increased Zofran use.  Presents with mild dehydration.  Patient has had low-grade fever.  Urinalysis not typical for UTI.  Respiratory panel negative.  CT chest/abdomen/pelvis with  small anterior midline abdominal wall hernia above the umbilicus involving small bowel loop but no bowel obstruction.  Collapse stomach but wall somewhat thickened with some adjacent vascular engorgement.  1 acute to subacute anterior left-sided rib fracture.  Had plan for outpatient EGD to further evaluate chronic intermittent nausea/vomiting.  Suspect vomiting multifactorial.    Plan:    Trial of clear liquids per patient/wife request. N.p.o. after midnight. EGD tomorrow. Supportive measures. PPI twice daily. Recommend stress dose for adrenal insufficiency. HOLD Eliquis.   LOS: 0 days   We would like to thank you for the opportunity to participate in the care of Cohen A Gladd.  Shahira Fiske S. Lucile Hillmann, PA-C [facility] [phone] 6/27/20241:49 PM   

## 2022-09-01 NOTE — ED Triage Notes (Signed)
Pt c/o vomiting bile yesterday, which pts wife states this has happened previously before. Pts wife also states pt has had 9 strokes in the past in which he has sensory deficits from. Also states bilateral leg weakness/dragging feet when ambulating. Hx of stage 4 lung cancer in remission, T2D, and adrenal insuffiencey. Pts wife also states pulse at home was 110-140 so she brought him in to be evaluated

## 2022-09-02 ENCOUNTER — Encounter (HOSPITAL_COMMUNITY): Payer: Self-pay | Admitting: Internal Medicine

## 2022-09-02 ENCOUNTER — Observation Stay (HOSPITAL_COMMUNITY): Payer: Medicare Other | Admitting: Certified Registered"

## 2022-09-02 ENCOUNTER — Encounter (HOSPITAL_COMMUNITY)
Admission: EM | Disposition: A | Discharge status: Home / Self Care | Payer: Self-pay | Source: Home / Self Care | Attending: Emergency Medicine

## 2022-09-02 ENCOUNTER — Ambulatory Visit (HOSPITAL_COMMUNITY): Admission: RE | Admit: 2022-09-02 | Payer: Medicare Other | Source: Home / Self Care | Admitting: Internal Medicine

## 2022-09-02 ENCOUNTER — Observation Stay (HOSPITAL_BASED_OUTPATIENT_CLINIC_OR_DEPARTMENT_OTHER): Payer: Medicare Other | Admitting: Certified Registered"

## 2022-09-02 DIAGNOSIS — R1114 Bilious vomiting: Secondary | ICD-10-CM

## 2022-09-02 DIAGNOSIS — R1110 Vomiting, unspecified: Secondary | ICD-10-CM | POA: Diagnosis not present

## 2022-09-02 DIAGNOSIS — R112 Nausea with vomiting, unspecified: Principal | ICD-10-CM

## 2022-09-02 DIAGNOSIS — E860 Dehydration: Secondary | ICD-10-CM | POA: Diagnosis not present

## 2022-09-02 DIAGNOSIS — E2740 Unspecified adrenocortical insufficiency: Secondary | ICD-10-CM | POA: Diagnosis not present

## 2022-09-02 DIAGNOSIS — K3184 Gastroparesis: Secondary | ICD-10-CM | POA: Diagnosis not present

## 2022-09-02 DIAGNOSIS — I10 Essential (primary) hypertension: Secondary | ICD-10-CM

## 2022-09-02 HISTORY — PX: ESOPHAGOGASTRODUODENOSCOPY (EGD) WITH PROPOFOL: SHX5813

## 2022-09-02 LAB — BASIC METABOLIC PANEL
Anion gap: 7 (ref 5–15)
BUN: 21 mg/dL (ref 8–23)
CO2: 25 mmol/L (ref 22–32)
Calcium: 8 mg/dL — ABNORMAL LOW (ref 8.9–10.3)
Chloride: 104 mmol/L (ref 98–111)
Creatinine, Ser: 1.43 mg/dL — ABNORMAL HIGH (ref 0.61–1.24)
GFR, Estimated: 52 mL/min — ABNORMAL LOW (ref >60)
Glucose, Bld: 263 mg/dL — ABNORMAL HIGH (ref 70–99)
Potassium: 4.6 mmol/L (ref 3.5–5.1)
Sodium: 136 mmol/L (ref 135–145)

## 2022-09-02 LAB — MAGNESIUM: Magnesium: 2 mg/dL (ref 1.7–2.4)

## 2022-09-02 LAB — CULTURE, BLOOD (ROUTINE X 2)

## 2022-09-02 LAB — GLUCOSE, CAPILLARY
Glucose-Capillary: 260 mg/dL — ABNORMAL HIGH (ref 70–99)
Glucose-Capillary: 211 mg/dL — ABNORMAL HIGH (ref 70–99)
Glucose-Capillary: 394 mg/dL — ABNORMAL HIGH (ref 70–99)

## 2022-09-02 SURGERY — ESOPHAGOGASTRODUODENOSCOPY (EGD) WITH PROPOFOL
Anesthesia: General

## 2022-09-02 MED ORDER — PROPOFOL 10 MG/ML IV BOLUS
INTRAVENOUS | Status: DC | PRN
  Administered 2022-09-02: 100 mg via INTRAVENOUS

## 2022-09-02 MED ORDER — PROPOFOL 500 MG/50ML IV EMUL
INTRAVENOUS | Status: AC
  Filled 2022-09-02: qty 50

## 2022-09-02 MED ORDER — ONDANSETRON 4 MG PO TBDP: 4.0000 mg | ORAL_TABLET | Freq: Three times a day (TID) | ORAL | 1 refills | Status: DC

## 2022-09-02 MED ORDER — PROPOFOL 500 MG/50ML IV EMUL
INTRAVENOUS | Status: DC | PRN
  Administered 2022-09-02: 175 ug/kg/min via INTRAVENOUS

## 2022-09-02 MED ORDER — ONDANSETRON 4 MG PO TBDP: 4.0000 mg | ORAL_TABLET | Freq: Three times a day (TID) | ORAL | Status: DC | PRN

## 2022-09-02 MED ORDER — LIDOCAINE HCL (CARDIAC) PF 100 MG/5ML IV SOSY
PREFILLED_SYRINGE | INTRAVENOUS | Status: DC | PRN
  Administered 2022-09-02: 80 mg via INTRAVENOUS

## 2022-09-02 MED ORDER — HYDROCORTISONE 20 MG PO TABS: 10.0000 mg | ORAL_TABLET | Freq: Every morning | ORAL | Status: DC

## 2022-09-02 MED ORDER — LACTATED RINGERS IV SOLN: INTRAVENOUS | Status: DC | PRN

## 2022-09-02 NOTE — Care Management Obs Status (Signed)
MEDICARE OBSERVATION STATUS NOTIFICATION   Patient Details  Name: Jack Mcbride MRN: [id number] Date of Birth: Apr 30, 1950   Medicare Observation Status Notification Given:  Yes    Corey Harold 09/02/2022, 4:35 PM

## 2022-09-02 NOTE — Interval H&P Note (Signed)
History and Physical Interval Note:  09/02/2022 11:27 AM  Jack Mcbride  has presented today for surgery, with the diagnosis of nausea, vomiting.  The various methods of treatment have been discussed with the patient and family. After consideration of risks, benefits and other options for treatment, the patient has consented to  Procedure(s) with comments: ESOPHAGOGASTRODUODENOSCOPY (EGD) WITH PROPOFOL (N/A) - 12:45pm, asa 3 as a surgical intervention.  The patient's history has been reviewed, patient examined, no change in status, stable for surgery.  I have reviewed the patient's chart and labs.  Questions were answered to the patient's satisfaction.     Jack Mcbride  Patient seen and examined in short stay.  Stable overnight.  Nausea improved he feels much better.  He wants to eat.  He denies dysphagia.  Will proceed with an EGD as per plan. The risks, benefits, limitations, alternatives and imponderables have been reviewed with the patient. Potential for esophageal dilation, biopsy, etc. have also been reviewed.  Questions have been answered. All parties agreeable.

## 2022-09-02 NOTE — Discharge Summary (Signed)
Physician Discharge Summary   Patient: Jack Mcbride MRN: [id number] DOB: Aug 13, 1950  Admit date:     09/01/2022  Discharge date: 09/02/22  Discharge Physician: Onalee Hua Lilianne Delair   PCP: Jonathon Bellows, DO   Recommendations at discharge:   Please follow up with primary care provider within 1-2 weeks  Please repeat BMP and CBC in one week    Hospital Course: 72 year old male with a history of non-small cell lung cancer in remission previously on Keytruda (since May 2022 through Jan 2024), adrenal insufficiency on hydrocortisone supplementation, hypertension, hyperlipidemia, diabetes mellitus type 2, BPH, 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  Notably, the patient has been struggling with early satiety with nausea and vomiting for the better part of 3 months.  He last saw GI, Dr. Jena Gauss on 08/19/2022 with plans for upcoming EGD. However, in the last 24 to 48 hours he has had increased frequency of his bilious vomiting, up to 4 episodes in the last 24 hours.  There is no hematemesis.  The patient has had increasing generalized weakness in the past 24 hours.  As result he presented to the emergency department for further evaluation.  The patient's spouse at the bedside supplements the history.  Notably, she has noted some low-grade temperatures from 99.0 F to 100.0 F in the past 24 hours.  The patient has had a frontal headache.  He denies any neck pain.  He denies any chest pain, shortness breath, coughing, hemoptysis, abdominal pain, dysuria, hematuria.  At baseline, the patient is able to ambulate with the assistance of a rollator.  He is able to make transfers.  However in the last 24 hours he has had increasing generalized weakness with difficulty even getting out of bed.  Wife states that she doubled up his dose of hydrocortisone on 08/31/2022.  She also doubled up on his morning dose of hydrocortisone on the morning of  09/01/2022.  His last dose of apixaban was on the evening of 08/30/2022.  Notably, the patient had a recent hospital admission at [facility] from 05/04/2022 to 05/13/2022.  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 coli and Clostridium bacteremia.  He was also treated for bilateral aspiration pneumonia.  He was subsequently discharged home on his baseline dose of hydrocortisone, 20 mg in the morning, 10 mg in the evening.  In the emergency department, the patient was afebrile hemodynamically stable.  He did have low-grade temperature 99.3 F.  He was mildly tachycardic HR 105-110.  He was hemodynamically stable.  Oxygen saturation 95% room air.  CT of the chest showed tiny left pleural effusion.  There was a 2-3 mm RUL nodule.  There are mild areas of bronchial wall thickening and interstitial changes.  There is no consolidation or pneumothorax.  CT of the abdomen and pelvis was negative for any gallbladder wall thickening.  There was moderate diffuse colonic stool with scattered diverticula.  Small bowel is nondilated.  Stomach is collapsed but with wall thickening and some adjacent vascular engorgement.  There is prostamegaly.  WBC 5.0, hemoglobin 10.4, platelets 204,000.  Sodium 137, potassium 3.6, bicarbonate 27, serum creatinine 1.48.  AST 14, ALT 14, alk phosphatase 76, total bilirubin 0.6, lipase 23. The patient was started on IV fluids.  GI was consulted to assist with management.  Assessment and Plan: Intractable nausea and vomiting -multifactorial including a degree of relative adrenal insufficiency, gastroparesis and  CNS insult to nausea center from prior CVA -Started Zofran around-the-clock during hospitalization -Clear liquid diet initially prior to EGD -Start pantoprazole -GI consult appreciated -EGD 09/02/2022--normal esophagus, stomach, duodenum -diet advanced after EGD--pt ate sandwich, chips, fruit which he  tolerated -6/28--discussed with GI>>cleared for d/c with dysphagia diet and zofran q ac and pantoprazole bid   CKD stage IIIa -Baseline creatinine 1.5-1.7 -serum creatinine 1.43 on day of dc   Generalized weakness -Multifactorial including dehydration, acute medical illness, deconditioning -The patient has had a decline in his functional status over the past 1-1/2 years -PT evaluations when stabilized -B12--306 -TSH--0.357 -Folate--12.0   Adrenal insufficiency -Plan to give the patient IV hydrocortisone stress dosing until able to tolerate p.o. -pt will transition back to po hydrocortisone -instructed to take double am and pm dose for next 24 hours after d/c then back to baseline dose   Non-small cell lung cancer -Recent imaging shows no signs of active malignancy -Keytruda currently on hold per oncologist Dr. Veronia Beets   Diabetes mellitus type 2 with hyperglycemia -06/16/2022 hemoglobin A1c 8.0 -At baseline, patient takes Lantus 18 units at bedtime and lispro 20-20-20 with 2 units for every 50 points above 200   History of embolic stroke -Continue apixaban   Mixed hyperlipidemia -Continue statin   Hypothyroidism -Continue Synthroid       Consultants: GI Procedures performed: none  Disposition: Home Diet recommendation:  Carb modified diet DISCHARGE MEDICATION: Allergies as of 09/02/2022       Reactions   Codeine Itching        Medication List     TAKE these medications    amLODipine 10 MG tablet Commonly known as: NORVASC Take 10 mg by mouth daily.   apixaban 5 MG Tabs tablet Commonly known as: ELIQUIS Take 5 mg by mouth 2 (two) times daily.   aspirin 81 MG tablet Take 81 mg by mouth at bedtime.   atorvastatin 80 MG tablet Commonly known as: LIPITOR Take 80 mg by mouth every Monday, Wednesday, and Friday.   cetirizine 10 MG tablet Commonly known as: ZYRTEC Take 10 mg by mouth daily as needed for allergies.   escitalopram 10 MG  tablet Commonly known as: LEXAPRO Take 30 mg by mouth daily with breakfast.   finasteride 5 MG tablet Commonly known as: PROSCAR Take 5 mg by mouth at bedtime.   FreeStyle Libre 2 Reader Hardie Pulley USE TO TEST BLOOD GLUCOSE LEVELS ONCE A DAY E11.9   FreeStyle Libre 2 Sensor Misc TEST BLOOD GLUCOSE LEVEL ONCE A DAY E11.9   HumaLOG KwikPen 100 UNIT/ML KwikPen Generic drug: insulin lispro Inject 18-20 Units into the skin See admin instructions. Inject 20 units under the skin in the morning with breakfast and inject 18 units with lunch 18u supper   hydrocortisone 10 MG tablet Commonly known as: CORTEF Take 20 mg by mouth every evening. What changed: Another medication with the same name was changed. Make sure you understand how and when to take each.   hydrocortisone 20 MG tablet Commonly known as: CORTEF Take 0.5-1 tablets (10-20 mg total) by mouth every morning. Take 40 mg in am and 20 mg in pm for next 24 hours after d/c, then back to 20mg  in am, 10mg  in the afternoon on 6/30 What changed: additional instructions   insulin glargine 100 UNIT/ML injection Commonly known as: LANTUS Inject 16 Units into the skin at bedtime.   levothyroxine 75 MCG tablet Commonly known as: SYNTHROID Take 75 mcg by mouth daily before breakfast.  metoprolol succinate 50 MG 24 hr tablet Commonly known as: TOPROL-XL Take 50 mg by mouth daily.   ondansetron 4 MG disintegrating tablet Commonly known as: ZOFRAN-ODT DISSOLVE 1 TAB IN MOUTH EVERY 4 HOURS AS NEEDED FOR NAUSEA AND VOMIT What changed: See the new instructions.   ondansetron 4 MG disintegrating tablet Commonly known as: ZOFRAN-ODT Take 1 tablet (4 mg total) by mouth 3 (three) times daily before meals. What changed: You were already taking a medication with the same name, and this prescription was added. Make sure you understand how and when to take each.   pantoprazole 40 MG tablet Commonly known as: PROTONIX TAKE 1 TABLET BY MOUTH TWICE  A DAY BEFORE A MEAL What changed: See the new instructions.   tamsulosin 0.4 MG Caps capsule Commonly known as: FLOMAX Take 0.4 mg by mouth every other day.   traZODone 50 MG tablet Commonly known as: DESYREL Take 50 mg by mouth at bedtime.        Discharge Exam: Filed Weights   09/01/22 0843 09/02/22 1010  Weight: 86.2 kg 86.2 kg   HEENT:  [place]rost[place]/AT, No thrush, no icterus CV:  RRR, no rub, no S3, no S4 Lung:  CTA, no wheeze, no rhonchi Abd:  soft/+BS, NT Ext:  No edema, no lymphangitis, no synovitis, no rash   Condition at discharge: stable  The results of significant diagnostics from this hospitalization (including imaging, microbiology, ancillary and laboratory) are listed below for reference.   Imaging Studies: CT CHEST ABDOMEN PELVIS W CONTRAST  Result Date: 09/01/2022 CLINICAL DATA:  Vomiting.  Weakness EXAM: CT CHEST, ABDOMEN, AND PELVIS WITH CONTRAST TECHNIQUE: Multidetector CT imaging of the chest, abdomen and pelvis was performed following the standard protocol during bolus administration of intravenous contrast. RADIATION DOSE REDUCTION: This exam was performed according to the departmental dose-optimization program which includes automated exposure control, adjustment of the mA and/or kV according to patient size and/or use of iterative reconstruction technique. CONTRAST:  OMNIPAQUE IOHEXOL 300 MG/ML  SOLN COMPARISON:  CT angiogram 01/25/2018 FINDINGS: CT CHEST FINDINGS Cardiovascular: Small pericardial effusion. Coronary artery calcifications are seen. The heart is nonenlarged. The thoracic aorta has a normal course and caliber with scattered vascular calcifications. There is a bovine type aortic arch. There is a right IJ chest port in place with the tip extending to the central SVC above the right atrium. Mediastinum/Nodes: No abnormal lymph node enlargement identified in the axillary regions, hilum or mediastinum. Small thyroid gland. Normal caliber thoracic  esophagus. Lungs/Pleura: Tiny left pleural effusion. No right-sided pleural effusion. 2-3 mm right upper lobe lung nodule on series 4, image 26 laterally. There is breathing motion throughout the examination. There are mild areas of bronchial wall thickening and interstitial changes, nonspecific and possibly chronic. No consolidation or pneumothorax. Musculoskeletal: Degenerative changes of the spine. Osteopenia. There are some old left-sided rib fractures identified. However there is a fracture of the anterior aspect of the left sixth rib which may be more acute to subacute. Please correlate with history. CT ABDOMEN PELVIS FINDINGS Hepatobiliary: No focal liver abnormality is seen. No gallstones, gallbladder wall thickening, or biliary dilatation. Pancreas: Diffuse fatty atrophy of the pancreas.  No mass lesion. Spleen: Normal in size without focal abnormality. Adrenals/Urinary Tract: Adrenal glands are preserved. No enhancing renal mass or collecting system dilatation. Ureters have normal course and caliber dome of the bladder. Bladder has a preserved contour but slight wall thickening. Stomach/Bowel: No oral contrast. There is moderate diffuse colonic stool. Few left-sided  colonic diverticula. Appendix is poorly seen in the right lower quadrant but no pericecal stranding or fluid. Small bowel is nondilated. There is a midline anterior abdominal wall small hernia involving loops of small bowel without obstruction. This is above the umbilicus. Stomach is collapsed but there is wall thickening along the stomach with some adjacent vascular engorgement. Please correlate with any symptoms of gastritis or other gastric pathology. Vascular/Lymphatic: Moderate vascular calcifications along the aorta and branch vessels. There are areas of stenosis along the iliac vessels. Normal caliber IVC. No specific abnormal lymph node enlargement identified in the abdomen and pelvis. Reproductive: Enlarged prostate with mass effect  along the base of the bladder. Please correlate with the patient's PSA. Other: No free air or free fluid. Musculoskeletal: Scattered degenerative changes of the spine and pelvis. IMPRESSION: Collapsed stomach but wall is somewhat thickened and there is some adjacent vascular engorgement. Please correlate for any particular symptoms including for gastritis or other gastric pathology. No bowel obstruction, free air or free fluid. Few colonic diverticula. Diffuse stool. Small anterior midline abdominal wall hernia above the umbilicus with involvement of small bowel loop. Again no small bowel obstruction. Tiny left pleural effusion. Chest port. Multiple rib fractures. Many of which are old. There is 1 acute to subacute anterior left-sided rib fracture. Please correlate with symptoms. 3 mm right-sided lung nodule. Although likely benign, if the patient is high-risk, given the morphology and/or location of this nodule a non-contrast chest CT can be considered in 12 months.This recommendation follows the consensus statement: Guidelines for Management of Incidental Pulmonary Nodules Detected on CT Images: From the Fleischner Society 2017; Radiology 2017; 284:228-243. Electronically Signed   By: Karen Kays M.D.   On: 09/01/2022 10:47   CT Head Wo Contrast  Result Date: 09/01/2022 CLINICAL DATA:  Headache, neuro deficit EXAM: CT HEAD WITHOUT CONTRAST TECHNIQUE: Contiguous axial images were obtained from the base of the skull through the vertex without intravenous contrast. RADIATION DOSE REDUCTION: This exam was performed according to the departmental dose-optimization program which includes automated exposure control, adjustment of the mA and/or kV according to patient size and/or use of iterative reconstruction technique. COMPARISON:  None Available. FINDINGS: Brain: No hemorrhage. No hydrocephalus. No CT evidence of an acute cortical infarct. Sequela of moderate chronic microvascular ischemic change with chronic  small-vessel infarcts in the left basal ganglia and thalamocapsular regions bilaterally. In the. Generalized volume loss left middle cranial fossa arachnoid cyst. Vascular: No hyperdense vessel or unexpected calcification. Skull: Normal. Negative for fracture or focal lesion. Sinuses/Orbits: No middle ear or mastoid effusion. Mucosal thickening right maxillary sinus. Orbits are unremarkable. Other: None. IMPRESSION: 1. No acute intracranial process. 2. Sequela of moderate chronic microvascular ischemic change with chronic small-vessel infarcts in the left basal ganglia and thalamocapsular regions bilaterally Electronically Signed   By: Lorenza Cambridge M.D.   On: 09/01/2022 10:39   DG Chest Port 1 View  Result Date: 09/01/2022 CLINICAL DATA:  [address] Infection [address] EXAM: PORTABLE CHEST 1 VIEW COMPARISON:  None Available. FINDINGS: Right-sided chest port terminates at the superior cavoatrial junction. Overlying loop recorder. Mild cardiomegaly. Aortic atherosclerosis. Minimal streaky bibasilar opacities. No pleural effusion or pneumothorax. IMPRESSION: Minimal streaky bibasilar opacities, atelectasis versus infiltrate. Electronically Signed   By: Duanne Guess D.O.   On: 09/01/2022 09:51    Microbiology: Results for orders placed or performed during the hospital encounter of 09/01/22  Culture, blood (Routine X 2) w Reflex to ID Panel     Status: None (Preliminary  result)   Collection Time: 09/01/22  8:56 AM   Specimen: Left Antecubital; Blood  Result Value Ref Range Status   Specimen Description   Final    LEFT ANTECUBITAL BOTTLES DRAWN AEROBIC AND ANAEROBIC   Special Requests   Final    Blood Culture results may not be optimal due to an excessive volume of blood received in culture bottles   Culture   Final    NO GROWTH < 24 HOURS Performed at [facility], [address], [place], [place] [address]    Report Status PENDING  Incomplete  Culture, blood (Routine X 2) w Reflex to ID Panel     Status:  None (Preliminary result)   Collection Time: 09/01/22  8:56 AM   Specimen: Right Antecubital; Blood  Result Value Ref Range Status   Specimen Description   Final    RIGHT ANTECUBITAL BOTTLES DRAWN AEROBIC AND ANAEROBIC   Special Requests Blood Culture adequate volume  Final   Culture   Final    NO GROWTH < 24 HOURS Performed at [facility], [address], [place], [place] [address]    Report Status PENDING  Incomplete  Resp panel by RT-PCR (RSV, Flu A&B, Covid) Anterior Nasal Swab     Status: None   Collection Time: 09/01/22  9:40 AM   Specimen: Anterior Nasal Swab  Result Value Ref Range Status   SARS Coronavirus 2 by RT PCR NEGATIVE NEGATIVE Final    Comment: (NOTE) SARS-CoV-2 target nucleic acids are NOT DETECTED.  The SARS-CoV-2 RNA is generally detectable in upper respiratory specimens during the acute phase of infection. The lowest concentration of SARS-CoV-2 viral copies this assay can detect is 138 copies/mL. A negative result does not preclude SARS-Cov-2 infection and should not be used as the sole basis for treatment or other patient management decisions. A negative result may occur with  improper specimen collection/handling, submission of specimen other than nasopharyngeal swab, presence of viral mutation(s) within the areas targeted by this assay, and inadequate number of viral copies(<138 copies/mL). A negative result must be combined with clinical observations, patient history, and epidemiological information. The expected result is Negative.  Fact Sheet for Patients:  BloggerCourse.com  Fact Sheet for Healthcare Providers:  SeriousBroker.it  This test is no t yet approved or cleared by the [place] FDA and  has been authorized for detection and/or diagnosis of SARS-CoV-2 by FDA under an Emergency Use Authorization (EUA). This EUA will remain  in effect (meaning this test can be used) for the duration of  the COVID-19 declaration under Section 564(b)(1) of the Act, 21 U.S.C.section 360bbb-3(b)(1), unless the authorization is terminated  or revoked sooner.       Influenza A by PCR NEGATIVE NEGATIVE Final   Influenza B by PCR NEGATIVE NEGATIVE Final    Comment: (NOTE) The Xpert Xpress SARS-CoV-2/FLU/RSV plus assay is intended as an aid in the diagnosis of influenza from Nasopharyngeal swab specimens and should not be used as a sole basis for treatment. Nasal washings and aspirates are unacceptable for Xpert Xpress SARS-CoV-2/FLU/RSV testing.  Fact Sheet for Patients: BloggerCourse.com  Fact Sheet for Healthcare Providers: SeriousBroker.it  This test is not yet approved or cleared by the [place] FDA and has been authorized for detection and/or diagnosis of SARS-CoV-2 by FDA under an Emergency Use Authorization (EUA). This EUA will remain in effect (meaning this test can be used) for the duration of the COVID-19 declaration under Section 564(b)(1) of the Act, 21 U.S.C. section 360bbb-3(b)(1),  unless the authorization is terminated or revoked.     Resp Syncytial Virus by PCR NEGATIVE NEGATIVE Final    Comment: (NOTE) Fact Sheet for Patients: BloggerCourse.com  Fact Sheet for Healthcare Providers: SeriousBroker.it  This test is not yet approved or cleared by the [place] FDA and has been authorized for detection and/or diagnosis of SARS-CoV-2 by FDA under an Emergency Use Authorization (EUA). This EUA will remain in effect (meaning this test can be used) for the duration of the COVID-19 declaration under Section 564(b)(1) of the Act, 21 U.S.C. section 360bbb-3(b)(1), unless the authorization is terminated or revoked.  Performed at [facility], [address], [place], [place] [address]     Labs: CBC: Recent Labs  Lab 09/01/22 0856  WBC 5.0  NEUTROABS 3.9  HGB  10.4*  HCT 32.5*  MCV 89.0  PLT 202   Basic Metabolic Panel: Recent Labs  Lab 09/01/22 0856 09/02/22 0436  NA 137 136  K 3.6 4.6  CL 102 104  CO2 27 25  GLUCOSE 234* 263*  BUN 25* 21  CREATININE 1.48* 1.43*  CALCIUM 8.5* 8.0*  MG  --  2.0   Liver Function Tests: Recent Labs  Lab 09/01/22 0856  AST 16  ALT 14  ALKPHOS 76  BILITOT 0.6  PROT 6.0*  ALBUMIN 3.0*   CBG: Recent Labs  Lab 09/01/22 0910 09/01/22 2111 09/02/22 0705 09/02/22 1011 09/02/22 1615  GLUCAP 223* 333* 260* 211* 394*    Discharge time spent: greater than 30 minutes.  Signed: Catarina Hartshorn, MD [facility] 09/02/2022

## 2022-09-02 NOTE — Inpatient Diabetes Management (Signed)
Inpatient Diabetes Program Recommendations  AACE/ADA: New Consensus Statement on Inpatient Glycemic Control (2015)  Target Ranges:  Prepandial:   less than 140 mg/dL      Peak postprandial:   less than 180 mg/dL (1-2 hours)      Critically ill patients:  140 - 180 mg/dL   Lab Results  Component Value Date   GLUCAP 260 (H) 09/02/2022    Review of Glycemic Control  Latest Reference Range & Units 09/01/22 21:11 09/02/22 07:05  Glucose-Capillary 70 - 99 mg/dL 161 (H) 096 (H)  (H): Data is abnormally high Diabetes history: Type 2 DM Outpatient Diabetes medications: Humalog 20/20/20, Lantus 18 units QHS Current orders for Inpatient glycemic control: Semglee 5 units at bedtime, Novolog 0-9 units TID & HS Solucortef 40 mg at bedtime, 50 mg QA  Inpatient Diabetes Program Recommendations:    Consider increasing Semglee to 15 units at bedtime and Novolog 3 units TID (assuming patient is consuming >50% of meals; once diet resumes).   Thanks, Lujean Rave, MSN, RNC-OB Diabetes Coordinator [phone] (8a-5p)

## 2022-09-02 NOTE — Progress Notes (Signed)
PT Cancellation Note  Patient Details Name: TREVAUN CHRISMAN MRN: [id number] DOB: 05-Jun-1950   Cancelled Treatment:    Reason Eval/Treat Not Completed: Patient at procedure or test/unavailable. Patient off floor. PT will check back later as time allows to complete evaluation.   Katina Dung. Hartnett-Rands, MS, PT Per Diem PT [facility] [phone]  09/02/2022, 10:43 AM

## 2022-09-02 NOTE — Progress Notes (Signed)
Transition of Care [facility]) - Inpatient Brief Assessment   Patient Details  Name: Jack Mcbride MRN: [id number] Date of Birth: May 23, 1950  Transition of [facility]) CM/SW Contact:    Annice Needy, LCSW Phone Number: 09/02/2022, 1:15 PM   Clinical Narrative: Transition of Care Department [facility]) has reviewed patient and no TOC needs have been identified at this time. We will continue to monitor patient advancement through interdisciplinary progression rounds. If new patient transition needs arise, please place a TOC consult.   Transition of Care Asessment: Insurance and Status: Insurance coverage has been reviewed Patient has primary care physician: Yes Home environment has been reviewed: from home with spouse Prior level of function:: independent Prior/Current Home Services: No current home services Social Determinants of Health Reivew: SDOH reviewed no interventions necessary Readmission risk has been reviewed: Yes Transition of care needs: no transition of care needs at this time

## 2022-09-02 NOTE — Progress Notes (Signed)
PT Cancellation Note  Patient Details Name: Jack Mcbride MRN: [id number] DOB: December 02, 1950   Cancelled Treatment:    Reason Eval/Treat Not Completed: Patient's level of consciousness. Patient returned to room approximately an hour ago. PT consulted with nursing - recommendation to wait on PT evaluation at this time as patient has not eaten post surgery. PT will attempt eval another date.  Katina Dung. Hartnett-Rands, MS, PT Per Diem PT [facility] [phone] Britta Mccreedy  Hartnett-Rands 09/02/2022, 2:20 PM

## 2022-09-02 NOTE — Interval H&P Note (Signed)
History and Physical Interval Note:  09/02/2022 11:30 AM  Jack Mcbride  has presented today for surgery, with the diagnosis of nausea, vomiting.  The various methods of treatment have been discussed with the patient and family. After consideration of risks, benefits and other options for treatment, the patient has consented to  Procedure(s) with comments: ESOPHAGOGASTRODUODENOSCOPY (EGD) WITH PROPOFOL (N/A) - 12:45pm, asa 3 as a surgical intervention.  The patient's history has been reviewed, patient examined, no change in status, stable for surgery.  I have reviewed the patient's chart and labs.  Questions were answered to the patient's satisfaction.     [name]  Patient seen and examined in short stay.  No dysphagia.  Agree with need for EGD.  The risks, benefits, limitations, alternatives and imponderables have been reviewed with the patient. Potential for esophageal dilation, biopsy, etc. have also been reviewed.  Questions have been answered. All parties agreeable.

## 2022-09-02 NOTE — Transfer of Care (Signed)
Immediate Anesthesia Transfer of Care Note  Patient: Jack Mcbride  Procedure(s) Performed: ESOPHAGOGASTRODUODENOSCOPY (EGD) WITH PROPOFOL  Patient Location: PACU  Anesthesia Type:General  Level of Consciousness: drowsy and patient cooperative  Airway & Oxygen Therapy: Patient Spontanous Breathing  Post-op Assessment: Report given to RN  Post vital signs: Reviewed and stable  Last Vitals:  Vitals Value Taken Time  BP 118/60 09/02/22 1150  Temp 98 F 09/02/22   1150  Pulse 63 09/02/22 1152  Resp 17 09/02/22 1152  SpO2 100 % 09/02/22 1152  Vitals shown include unvalidated device data.  Last Pain:  Vitals:   09/02/22 1010  TempSrc: Oral  PainSc: 0-No pain      Patients Stated Pain Goal: 0 (09/01/22 2115)  Complications: No notable events documented.

## 2022-09-03 LAB — HEMOGLOBIN A1C
Hgb A1c MFr Bld: 8.8 % — ABNORMAL HIGH (ref 4.8–5.6)
Mean Plasma Glucose: 206 mg/dL

## 2022-09-03 LAB — CULTURE, BLOOD (ROUTINE X 2)

## 2022-09-04 LAB — CULTURE, BLOOD (ROUTINE X 2): Culture: NO GROWTH

## 2022-09-05 LAB — CULTURE, BLOOD (ROUTINE X 2)
Culture: NO GROWTH
Special Requests: ADEQUATE

## 2022-09-05 NOTE — Anesthesia Preprocedure Evaluation (Signed)
Anesthesia Evaluation  Patient identified by MRN, date of birth, ID band Patient awake    Reviewed: Allergy & Precautions, H&P , NPO status , Patient's Chart, lab work & pertinent test results, reviewed documented beta blocker date and time   Airway Mallampati: II  TM Distance: >3 FB Neck ROM: full    Dental no notable dental hx.    Pulmonary neg pulmonary ROS, former smoker   Pulmonary exam normal breath sounds clear to auscultation       Cardiovascular Exercise Tolerance: Good hypertension, + Peripheral Vascular Disease  negative cardio ROS  Rhythm:regular Rate:Normal     Neuro/Psych CVA negative neurological ROS  negative psych ROS   GI/Hepatic negative GI ROS, Neg liver ROS,GERD  ,,  Endo/Other  negative endocrine ROSdiabetes    Renal/GU Renal diseasenegative Renal ROS  negative genitourinary   Musculoskeletal   Abdominal   Peds  Hematology negative hematology ROS (+)   Anesthesia Other Findings   Reproductive/Obstetrics negative OB ROS                             Anesthesia Physical Anesthesia Plan  ASA: 3  Anesthesia Plan: General   Post-op Pain Management:    Induction:   PONV Risk Score and Plan: Propofol infusion  Airway Management Planned:   Additional Equipment:   Intra-op Plan:   Post-operative Plan:   Informed Consent: I have reviewed the patients History and Physical, chart, labs and discussed the procedure including the risks, benefits and alternatives for the proposed anesthesia with the patient or authorized representative who has indicated his/her understanding and acceptance.     Dental Advisory Given  Plan Discussed with: CRNA  Anesthesia Plan Comments:        Anesthesia Quick Evaluation

## 2022-09-05 NOTE — Anesthesia Postprocedure Evaluation (Signed)
Anesthesia Post Note  Patient: Jack Mcbride  Procedure(s) Performed: ESOPHAGOGASTRODUODENOSCOPY (EGD) WITH PROPOFOL  Patient location during evaluation: Phase II Anesthesia Type: General Level of consciousness: awake Pain management: pain level controlled Vital Signs Assessment: post-procedure vital signs reviewed and stable Respiratory status: spontaneous breathing and respiratory function stable Cardiovascular status: blood pressure returned to baseline and stable Postop Assessment: no headache and no apparent nausea or vomiting Anesthetic complications: no Comments: Late entry   No notable events documented.   Last Vitals:  Vitals:   09/02/22 1215 09/02/22 1319  BP: (!) 153/76 (!) 165/64  Pulse: 62 68  Resp: 18 16  Temp:  36.7 C  SpO2: 100% 96%    Last Pain:  Vitals:   09/02/22 1215  TempSrc:   PainSc: 0-No pain                 Windell Norfolk

## 2022-09-06 ENCOUNTER — Telehealth: Payer: Self-pay | Admitting: *Deleted

## 2022-09-06 LAB — CULTURE, BLOOD (ROUTINE X 2)

## 2022-09-06 NOTE — Telephone Encounter (Signed)
Message Received: Darene Lamer, Thressa Sheller, LPN  [facility] Can you please schedule pt since Mindy is out.       Previous Messages    ----- Message ----- From: Corbin Ade, MD Sent: 09/02/2022   3:56 PM EDT To: Mindy S Estudillo, CMA  Probably going home over the weekend.  Needs hospital follow-up with app in 3 to 4 weeks

## 2022-09-06 NOTE — Op Note (Addendum)
[facility] Patient Name: Jack Mcbride Procedure Date: 09/02/2022 9:36 AM MRN: [id number] Date of Birth: [birth year] Attending MD: Gennette Pac , MD, [id number] CSN: [id number] Age: 72 Admit Type: Inpatient Procedure:                Upper GI endoscopy Indications:              Nausea with vomiting Providers:                Gennette Pac, MD, Nena Polio, RN, Pandora Leiter, Technician Referring MD:              Medicines:                Propofol per Anesthesia Complications:            No immediate complications. Estimated Blood Loss:     Estimated blood loss: none. Procedure:                Pre-Anesthesia Assessment:                           - Prior to the procedure, a History and Physical                            was performed, and patient medications and                            allergies were reviewed. The patient's tolerance of                            previous anesthesia was also reviewed. The risks                            and benefits of the procedure and the sedation                            options and risks were discussed with the patient.                            All questions were answered, and informed consent                            was obtained. ASA Grade Assessment: III - A patient                            with severe systemic disease. After reviewing the                            risks and benefits, the patient was deemed in                            satisfactory condition to undergo the procedure.  After obtaining informed consent, the endoscope was                            passed under direct vision. Throughout the                            procedure, the patient's blood pressure, pulse, and                            oxygen saturations were monitored continuously. The                            GIF-H190 ([id number]) scope was introduced through the                             mouth, and advanced to the second part of duodenum.                            The upper GI endoscopy was accomplished without                            difficulty. The patient tolerated the procedure                            well. Scope In: 11:36:57 AM Scope Out: 11:40:51 AM Total Procedure Duration: 0 hours 3 minutes 54 seconds  Findings:      The examined esophagus was normal. Gastric cavity empty.      The entire examined stomach was normal. Patent pylorus.      The duodenal bulb and second portion of the duodenum were normal. Impression:               - Normal esophagus.                           - Normal stomach.                           - Normal duodenal bulb and second portion of the                            duodenum.                           - No specimens collected.                           -Etiology of nausea and vomiting likely                            multifactorial in etiology. I.e. known                            gastroparesis, CNS insult to the nausea center in  the brain from CVA, +/- symptomatic episodes of                            adrenal insufficiency) Moderate Sedation:      Moderate (conscious) sedation was personally administered by an       anesthesia professional. The following parameters were monitored: oxygen       saturation, heart rate, blood pressure, respiratory rate, EKG, adequacy       of pulmonary ventilation, and response to care. Recommendation:           - Return patient to hospital ward for observation.                           - Clear liquid diet. Advance rapidly as tolerated                            to a gastroparesis diet ie 4-5 small low fiber                            low-fat meals daily. Make Zofran 4 mg ODT tablets                            scheduled every 8 hours. Continue twice daily PPI                           -Adequate adrenal replacement therapy                           -Hopefully, he will  be able to go home in the next                            24 to 48 hours. If he continues to have recurrent                            episodes of nausea and vomiting I would entertain                            the possibility of low-dose Reglan remaining                            vigilant for potential side effects.                           -At patient request, I called Ms. Spiering at                            [phone], reviewed findings and                            recommendations. Questions answered. Procedure Code(s):        --- Professional ---                           [phone], Esophagogastroduodenoscopy, flexible,  transoral; diagnostic, including collection of                            specimen(s) by brushing or washing, when performed                            (separate procedure) Diagnosis Code(s):        --- Professional ---                           R11.2, Nausea with vomiting, unspecified CPT copyright 2022 American Medical Association. All rights reserved. The codes documented in this report are preliminary and upon coder review may  be revised to meet current compliance requirements. [name]. [name], MD Gennette Pac, MD 09/02/2022 11:55:51 AM This report has been signed electronically. Number of Addenda: 0

## 2022-09-07 NOTE — Telephone Encounter (Signed)
noted 

## 2022-09-09 ENCOUNTER — Encounter (HOSPITAL_COMMUNITY): Payer: Self-pay | Admitting: Internal Medicine

## 2022-10-17 ENCOUNTER — Telehealth: Payer: Self-pay

## 2022-10-17 NOTE — Telephone Encounter (Signed)
Pt's wife called stating that the pt was going to be unable to make his appt tomorrow due to being sick. Wife states that she has taken the pt off of his protonix due to it causing him to be nauseated and because she has read that it causes dementia. Pt is now experiencing abdominal discomfort since coming off of protonix. I informed wife that pt needs an office visit to discuss these issues and to address possibly starting him on another ppi. Wife verbalized understanding.

## 2022-10-18 ENCOUNTER — Ambulatory Visit: Payer: Medicare Other | Admitting: Internal Medicine

## 2023-01-03 ENCOUNTER — Ambulatory Visit: Payer: Medicare Other | Admitting: Internal Medicine

## 2023-01-03 ENCOUNTER — Encounter: Payer: Self-pay | Admitting: Internal Medicine

## 2023-01-03 VITALS — BP 116/67 | HR 103 | Temp 98.0°F | Ht 70.0 in | Wt 184.6 lb

## 2023-01-03 DIAGNOSIS — Z860101 Personal history of adenomatous and serrated colon polyps: Secondary | ICD-10-CM

## 2023-01-03 DIAGNOSIS — K5909 Other constipation: Secondary | ICD-10-CM

## 2023-01-03 DIAGNOSIS — K219 Gastro-esophageal reflux disease without esophagitis: Secondary | ICD-10-CM

## 2023-01-03 DIAGNOSIS — K5900 Constipation, unspecified: Secondary | ICD-10-CM | POA: Diagnosis not present

## 2023-01-03 DIAGNOSIS — K3184 Gastroparesis: Secondary | ICD-10-CM

## 2023-01-03 DIAGNOSIS — D126 Benign neoplasm of colon, unspecified: Secondary | ICD-10-CM

## 2023-01-03 MED ORDER — PANTOPRAZOLE SODIUM 40 MG PO TBEC: 40.0000 mg | DELAYED_RELEASE_TABLET | Freq: Every day | ORAL | 3 refills | Status: AC

## 2023-01-03 NOTE — Patient Instructions (Signed)
It was good seeing you again today!  As discussed, continue Protonix 40 mg pill take (1)   30 minutes before breakfast daily (dispense 90 with 3 refills).  Trial of Trulance 3 mg pills take 1 daily.  Samples provided.  When you run out of samples call me and let me know how that medication has worked for you.  Continue daily stool softener  Office visit in 3 months

## 2023-01-03 NOTE — Progress Notes (Unsigned)
Primary Care Physician:  Jonathon Bellows, DO Primary Gastroenterologist:  Dr. Jena Gauss  Pre-Procedure History & Physical: HPI:  Jack Mcbride is a 72 y.o. male here for follow-up constipation GERD history of gastroparesis multiple embolic strokes advanced Parkinson's disease.  GI issues include not intermittent nausea and vomiting and GERD he came off Protonix thinking that intermittent nausea and vomiting could be related.  He is on no PPI at this time.  Also, patient continues to be an issue with only modest improvement with the prior trials of Linzess Amitiza and MiraLAX.  Takes senna and stool softener daily.  Does not feel that he evacuates well.  History of advanced adenoma by size 2019 small polyps removed 2020.  Tentatively slated him for a 1 more surveillance colonoscopy 2025 depending on overall health.  Past Medical History:  Diagnosis Date   A-fib [facility])    Adrenal insufficiency (Addison's disease) (HCC)    Benign prostate hyperplasia    Diabetes (HCC)    Elevated PSA    GERD (gastroesophageal reflux disease)    History of kidney stones    Hypercholesterolemia    Hypertension    Impotence of organic origin    Kidney stone    PAD (peripheral artery disease) (HCC)    Peripheral artery disease (HCC)    s/p stenting   Skin cancer    on nose   Stroke (cerebrum) (HCC)    Stroke (HCC)    2013 and 2017; 2019   Urolithiasis     Past Surgical History:  Procedure Laterality Date   BACK SURGERY  05/2016   BIOPSY  12/28/2017   Procedure: BIOPSY;  Surgeon: Corbin Ade, MD;  [facility];  Service: Endoscopy;;  gastric bx's   COLONOSCOPY WITH PROPOFOL N/A 11/20/2017   Dr. Jena Gauss: Diverticulosis,two 5 to 30 mm polyps in the cecum and rectum.  Cecal polypectomy site tattooed.  Pathology revealed tubular adenoma, no high-grade dysplasia.  Recommend colonoscopy in 6 months.   COLONOSCOPY WITH PROPOFOL N/A 09/03/2018   sigmoid and descending colon diverticulosis, two  2-5 mm polyps in ascending colon and cecum, s/p removal. Polypoid mucosa and tubular adenoma. 5 year surveillance.    ESOPHAGOGASTRODUODENOSCOPY (EGD) WITH PROPOFOL N/A 12/28/2017   Dr. Jena Gauss: Normal esophagus, erosive gastropathy, duodenal erosions without bleeding.  Gastric biopsy benign.  No H. pylori on biopsy.   ESOPHAGOGASTRODUODENOSCOPY (EGD) WITH PROPOFOL N/A 09/02/2022   Procedure: ESOPHAGOGASTRODUODENOSCOPY (EGD) WITH PROPOFOL;  Surgeon: Corbin Ade, MD;  Location: [facility];  Service: Endoscopy;  Laterality: N/A;  12:45pm, asa 3   HERNIA REPAIR     umbilical   LOOP RECORDER INSERTION  06/27/2017   PERCUTANEOUS STENT INTERVENTION Left    Left leg for PAD   POLYPECTOMY  11/20/2017   Procedure: POLYPECTOMY;  Surgeon: Corbin Ade, MD;  [facility]: [facility];  Service: Endoscopy;;  cecal   POLYPECTOMY  09/03/2018   Procedure: POLYPECTOMY;  Surgeon: Corbin Ade, MD;  Location: [facility];  Service: Endoscopy;;  colon   SHOULDER SURGERY Right    torn rotator cuff    Prior to Admission medications   Medication Sig Start Date End Date Taking? Authorizing Provider  amLODipine (NORVASC) 10 MG tablet Take 10 mg by mouth daily.   Yes [provider]  apixaban (ELIQUIS) 5 MG TABS tablet Take 5 mg by mouth 2 (two) times daily.   Yes [provider]  aspirin 81 MG tablet Take 81 mg by mouth  at bedtime.   Yes [provider]  atorvastatin (LIPITOR) 80 MG tablet Take 80 mg by mouth every Monday, Wednesday, and Friday. 10/13/15  Yes [provider]  carbidopa-levodopa (SINEMET IR) 25-100 MG tablet Take by mouth. 10/10/22  Yes [provider]  Cholecalciferol (VITAMIN D3) 1.25 MG (50000 UT) CAPS Take 1 capsule by mouth once a week. 11/28/22  Yes [provider]  Continuous Blood Gluc Receiver (FREESTYLE LIBRE 2 READER) DEVI USE TO TEST BLOOD GLUCOSE LEVELS ONCE A DAY E11.9 11/02/21  Yes [provider]  Continuous Blood Gluc  Sensor (FREESTYLE LIBRE 2 SENSOR) MISC TEST BLOOD GLUCOSE LEVEL ONCE A DAY E11.9 11/02/21  Yes [provider]  escitalopram (LEXAPRO) 10 MG tablet Take 30 mg by mouth daily with breakfast. 12/05/19  Yes [provider]  finasteride (PROSCAR) 5 MG tablet Take 5 mg by mouth at bedtime. 12/31/21  Yes [provider]  fluticasone (FLONASE) 50 MCG/ACT nasal spray Place into both nostrils daily.   Yes [provider]  furosemide (LASIX) 20 MG tablet Take 20 mg by mouth daily. 12/12/22  Yes [provider]  guaiFENesin (MUCINEX) 600 MG 12 hr tablet Take by mouth 2 (two) times daily.   Yes [provider]  HUMALOG KWIKPEN 100 UNIT/ML KwikPen Inject 18-20 Units into the skin See admin instructions. Inject 20 units under the skin in the morning with breakfast and inject 18 units with lunch 18u supper 07/19/21  Yes [provider]  hydrocortisone (CORTEF) 10 MG tablet Take 10 mg by mouth daily. Takes at [employer], Historical, MD  hydrocortisone (CORTEF) 20 MG tablet Take 20 mg by mouth daily. Takes at 8am   Yes [provider]  insulin glargine (LANTUS) 100 UNIT/ML injection Inject 16 Units into the skin at bedtime.   Yes [provider]  levothyroxine (SYNTHROID) 75 MCG tablet Take 75 mcg by mouth daily before breakfast. 12/31/21  Yes [provider]  metoprolol succinate (TOPROL-XL) 50 MG 24 hr tablet Take 50 mg by mouth daily. 03/16/22  Yes [provider]  ondansetron (ZOFRAN-ODT) 4 MG disintegrating tablet DISSOLVE 1 TAB IN MOUTH EVERY 4 HOURS AS NEEDED FOR NAUSEA AND VOMIT 03/22/22  Yes Bernie Fobes, [name], MD  pantoprazole (PROTONIX) 40 MG tablet TAKE 1 TABLET BY MOUTH TWICE A DAY BEFORE A MEAL Patient taking differently: Take 40 mg by mouth 2 (two) times daily before a meal. 11/10/21  Yes Gelene Mink, NP  senna-docusate (SENNA PLUS) 8.6-50 MG tablet Take 1 tablet by mouth daily.   Yes [provider]  tamsulosin (FLOMAX) 0.4 MG CAPS capsule Take 0.4 mg by mouth every other day. 12/26/16  Yes [provider]  traZODone (DESYREL) 50 MG tablet Take 50 mg by mouth at bedtime.  02/05/17  Yes [provider]    Allergies as of 01/03/2023 - Review Complete 01/03/2023  Allergen Reaction Noted   Codeine Itching 09/05/2011    Family History  Problem Relation Age of Onset   Heart attack Mother    Diabetes Father    Colon cancer Neg Hx    Gastric cancer Neg Hx    Esophageal cancer Neg Hx     Social History   Socioeconomic History   Marital status: Married    Spouse name: Not on file   Number of children: Not on file   Years of education: Not on file   Highest education level: Not on file  Occupational History  Not on file  Tobacco Use   Smoking status: Former    Current packs/day: 0.00    Average packs/day: 0.3 packs/day for 49.1 years (12.3 ttl pk-yrs)    Types: Cigarettes    Start date: 03/07/1969    Quit date: 04/03/2018    Years since quitting: 4.7   Smokeless tobacco: Never  Vaping Use   Vaping status: Never Used  Substance and Sexual Activity   Alcohol use: No   Drug use: No   Sexual activity: Yes    Birth control/protection: None  Other Topics Concern   Not on file  Social History Narrative   Not on file   Social Determinants of Health   Financial Resource Strain: Patient Declined (10/26/2022)   Received from [facility]   Overall Financial Resource Strain (CARDIA)    Difficulty of Paying Living Expenses: Patient declined  Food Insecurity: Patient Declined (10/26/2022)   Received from [facility]   Hunger Vital Sign    Worried About Running Out of Food in the Last Year: Patient declined    Ran Out of Food in the Last Year: Patient declined  Transportation Needs: No Transportation Needs (10/26/2022)   Received from [facility]   PRAPARE - Transportation    In the past 12 months, has lack  of transportation kept you from medical appointments or from getting medications?: No    Lack of Transportation (Non-Medical): No  Physical Activity: Insufficiently Active (10/27/2021)   Received from [facility]   Exercise Vital Sign    Days of Exercise per Week: 2 days    Minutes of Exercise per Session: 30 min  Stress: No Stress Concern Present (10/27/2021)   Received from [facility], [facility] of Occupational Health - Occupational Stress Questionnaire    Feeling of Stress : Only a little  Social Connections: Socially Integrated (10/27/2021)   Received from [facility]   Social Connection and Isolation Panel [NHANES]    Frequency of Communication with Friends and Family: More than three times a week    Frequency of Social Gatherings with Friends and Family: More than three times a week    Attends Religious Services: More than 4 times per year    Active Member of [employer] or Organizations: Yes    Attends Engineer, structural: More than 4 times per year    Marital Status: Married  Catering manager Violence: Not At Risk (09/01/2022)   Humiliation, Afraid, Rape, and Kick questionnaire    Fear of Current or Ex-Partner: No    Emotionally Abused: No    Physically Abused: No    Sexually Abused: No    Review of Systems: See HPI, otherwise negative ROS  Physical Exam: BP 116/67 (BP Location: Right Arm, Patient Position: Sitting, Cuff Size: Normal)   Pulse (!) 103   Temp 98 F (36.7 C) (Oral)   Ht 5\' 10"  (1.778 m)   Wt 184 lb 9.6 oz (83.7 kg)   SpO2 96%   BMI 26.49 kg/m  General:   Ambulates with a walker.  Flat affect.  No acute distress.  Accompanied by his wife.   and cooperative in NAD SLungs:  Clear throughout to auscultation.   No wheezes, crackles, or rhonchi. No acute distress. Heart:  Regular rate and rhythm; no murmurs, clicks, rubs,  or gallops. Abdomen: Nondistended positive bowel sound soft and  nontender.   Impression/Plan: 72 year old gentleman with advanced Parkinson's  disease history of multiple embolic strokes, gastroparesis and constipation  GERD well-controlled historically on twice daily PPI.  Suboptimally controlled constipation as outlined above.  History of multiple colonic adenomas removed previously; tentatively due for surveillance exam 2025  Recommendations:  As discussed, continue Protonix 40 mg pill take (1)   30 minutes before breakfast daily (dispense 90 with 3 refills).  Trial of Trulance 3 mg pills take 1 daily.  Samples provided.  Progress report from pt after samples tried.  Continue daily stool softener  Office visit in 3 months     Notice: This dictation was prepared with Dragon dictation along with smaller phrase technology. Any transcriptional errors that result from this process are unintentional and may not be corrected upon review.

## 2023-01-18 ENCOUNTER — Telehealth: Payer: Self-pay

## 2023-01-18 NOTE — Telephone Encounter (Signed)
Pt's wife called stating that the Trulance helped but that the pt still had to strain. Please advise.

## 2023-01-18 NOTE — Telephone Encounter (Signed)
Pt's wife was made aware and verbalized understanding.

## 2023-03-21 ENCOUNTER — Other Ambulatory Visit: Payer: Self-pay | Admitting: *Deleted

## 2023-03-21 ENCOUNTER — Encounter: Payer: Self-pay | Admitting: Internal Medicine

## 2023-03-21 ENCOUNTER — Ambulatory Visit (INDEPENDENT_AMBULATORY_CARE_PROVIDER_SITE_OTHER): Payer: Medicare Other | Admitting: Internal Medicine

## 2023-03-21 VITALS — BP 118/63 | HR 74 | Temp 98.4°F | Ht 70.0 in | Wt 195.0 lb

## 2023-03-21 DIAGNOSIS — Z860100 Personal history of colon polyps, unspecified: Secondary | ICD-10-CM

## 2023-03-21 DIAGNOSIS — K3184 Gastroparesis: Secondary | ICD-10-CM | POA: Diagnosis not present

## 2023-03-21 DIAGNOSIS — R630 Anorexia: Secondary | ICD-10-CM

## 2023-03-21 DIAGNOSIS — K5909 Other constipation: Secondary | ICD-10-CM

## 2023-03-21 DIAGNOSIS — K5900 Constipation, unspecified: Secondary | ICD-10-CM | POA: Diagnosis not present

## 2023-03-21 DIAGNOSIS — K219 Gastro-esophageal reflux disease without esophagitis: Secondary | ICD-10-CM

## 2023-03-21 DIAGNOSIS — R1310 Dysphagia, unspecified: Secondary | ICD-10-CM

## 2023-03-21 DIAGNOSIS — R1319 Other dysphagia: Secondary | ICD-10-CM

## 2023-03-21 NOTE — Patient Instructions (Addendum)
 It was good to see you again today  It sounds like senna is working well for you.  It is okay to take that laxative daily  Continue taking pantoprazole  or Protonix  40 mg twice daily-best taken 30 minutes before breakfast and supper  Will proceed with a barium pill esophagram to further evaluate your difficulties with pills  Further recommendations to follow.

## 2023-03-21 NOTE — Progress Notes (Signed)
 Primary Care Physician:  Lonna Millman, DO Primary Gastroenterologist:  Dr. Shaaron  Pre-Procedure History & Physical: HPI:  Jack Mcbride is a 73 y.o. male here for follow-up GERD and constipation.  Multiple comorbidities including advanced Parkinson's, history of lung cancer and diabetes. Incidentally, has not yet seen a dietitian   for gastroparesis  management as of yet.  He has some anorexia these days.  Also complains of dysphagia to pills.  Normal-appearing esophagus on EGD back in June of last year.  Chronic constipation has been a challenge ; currently they settled on a regimen of 2 senna tablets daily.  This is producing a bowel movement every day and seems to be working  - no longer on   Linzess , Benefiber or Colace.   Past Medical History:  Diagnosis Date   A-fib [facility])    Adrenal insufficiency (Addison's disease) (HCC)    Benign prostate hyperplasia    Diabetes (HCC)    Elevated PSA    GERD (gastroesophageal reflux disease)    History of kidney stones    Hypercholesterolemia    Hypertension    Impotence of organic origin    Kidney stone    PAD (peripheral artery disease) (HCC)    Peripheral artery disease (HCC)    s/p stenting   Skin cancer    on nose   Stroke (cerebrum) (HCC)    Stroke (HCC)    2013 and 2017; 2019   Urolithiasis     Past Surgical History:  Procedure Laterality Date   BACK SURGERY  05/2016   BIOPSY  12/28/2017   Procedure: BIOPSY;  Surgeon: Shaaron Lamar HERO, MD;  [facility];  Service: Endoscopy;;  gastric bx's   COLONOSCOPY WITH PROPOFOL  N/A 11/20/2017   Dr. Shaaron: Diverticulosis,two 5 to 30 mm polyps in the cecum and rectum.  Cecal polypectomy site tattooed.  Pathology revealed tubular adenoma, no high-grade dysplasia.  Recommend colonoscopy in 6 months.   COLONOSCOPY WITH PROPOFOL  N/A 09/03/2018   sigmoid and descending colon diverticulosis, two 2-5 mm polyps in ascending colon and cecum, s/p removal. Polypoid mucosa and tubular  adenoma. 5 year surveillance.    ESOPHAGOGASTRODUODENOSCOPY (EGD) WITH PROPOFOL  N/A 12/28/2017   Dr. Shaaron: Normal esophagus, erosive gastropathy, duodenal erosions without bleeding.  Gastric biopsy benign.  No H. pylori on biopsy.   ESOPHAGOGASTRODUODENOSCOPY (EGD) WITH PROPOFOL  N/A 09/02/2022   Procedure: ESOPHAGOGASTRODUODENOSCOPY (EGD) WITH PROPOFOL ;  Surgeon: Shaaron Lamar HERO, MD;  [facility]: [facility];  Service: Endoscopy;  Laterality: N/A;  12:45pm, asa 3   HERNIA REPAIR     umbilical   LOOP RECORDER INSERTION  06/27/2017   PERCUTANEOUS STENT INTERVENTION Left    Left leg for PAD   POLYPECTOMY  11/20/2017   Procedure: POLYPECTOMY;  Surgeon: Shaaron Lamar HERO, MD;  [facility]: [facility];  Service: Endoscopy;;  cecal   POLYPECTOMY  09/03/2018   Procedure: POLYPECTOMY;  Surgeon: Shaaron Lamar HERO, MD;  Location: [facility];  Service: Endoscopy;;  colon   SHOULDER SURGERY Right    torn rotator cuff    Prior to Admission medications   Medication Sig Start Date End Date Taking? Authorizing Provider  amLODipine (NORVASC) 10 MG tablet Take 10 mg by mouth daily.   Yes [provider]  apixaban (ELIQUIS) 5 MG TABS tablet Take 5 mg by mouth 2 (two) times daily.   Yes [provider]  aspirin  81 MG tablet Take 81 mg by mouth at bedtime.   Yes [provider]  atorvastatin  (LIPITOR) 80 MG tablet Take 80 mg by mouth every Monday, Wednesday, and Friday. 10/13/15  Yes [provider]  carbidopa-levodopa (SINEMET IR) 25-100 MG tablet Take by mouth. 10/10/22  Yes [provider]  Cholecalciferol (VITAMIN D3) 1.25 MG (50000 UT) CAPS Take 1 capsule by mouth once a week. 11/28/22  Yes [provider]  Continuous Blood Gluc Receiver (FREESTYLE LIBRE 2 READER) DEVI USE TO TEST BLOOD GLUCOSE LEVELS ONCE A DAY E11.9 11/02/21  Yes [provider]  Continuous Blood Gluc Sensor (FREESTYLE LIBRE 2 SENSOR) MISC TEST BLOOD GLUCOSE LEVEL ONCE A DAY E11.9  11/02/21  Yes [provider]  escitalopram  (LEXAPRO ) 10 MG tablet Take 30 mg by mouth daily with breakfast. 12/05/19  Yes [provider]  finasteride  (PROSCAR ) 5 MG tablet Take 5 mg by mouth at bedtime. 12/31/21  Yes [provider]  guaiFENesin (MUCINEX) 600 MG 12 hr tablet Take by mouth 2 (two) times daily.   Yes [provider]  GVOKE HYPOPEN 2-PACK 1 MG/0.2ML SOAJ Inject into the skin. 03/07/23  Yes [provider]  HUMALOG KWIKPEN 100 UNIT/ML KwikPen Inject 18-20 Units into the skin See admin instructions. Inject 20 units under the skin in the morning with breakfast and inject 18 units with lunch 18u supper 07/19/21  Yes [provider]  hydrocortisone  (CORTEF ) 10 MG tablet Take 10 mg by mouth daily. Takes at [employer], Historical, MD  hydrocortisone  (CORTEF ) 20 MG tablet Take 20 mg by mouth daily. Takes at 8am   Yes [provider]  insulin  glargine (LANTUS ) 100 UNIT/ML injection Inject 16 Units into the skin at bedtime.   Yes [provider]  levothyroxine  (SYNTHROID ) 75 MCG tablet Take 75 mcg by mouth daily before breakfast. 12/31/21  Yes [provider]  losartan (COZAAR) 50 MG tablet Take 50 mg by mouth daily.   Yes [provider]  metoprolol  succinate (TOPROL -XL) 50 MG 24 hr tablet Take 50 mg by mouth daily. 03/16/22  Yes [provider]  ondansetron  (ZOFRAN -ODT) 4 MG disintegrating tablet DISSOLVE 1 TAB IN MOUTH EVERY 4 HOURS AS NEEDED FOR NAUSEA AND VOMIT 03/22/22  Yes Darrel Gloss, Lamar HERO, MD  pantoprazole  (PROTONIX ) 40 MG tablet Take 1 tablet (40 mg total) by mouth daily. TAKE 1 TABLET BY MOUTH TWICE A DAY BEFORE A MEAL 01/03/23  Yes Delanie Tirrell, Lamar HERO, MD  senna-docusate (SENNA PLUS) 8.6-50 MG tablet Take 1 tablet by mouth daily.   Yes [provider]  tamsulosin  (FLOMAX ) 0.4 MG CAPS capsule Take 0.4 mg by mouth every other day. 12/26/16  Yes [provider]  traZODone   (DESYREL ) 50 MG tablet Take 50 mg by mouth at bedtime.  02/05/17  Yes [provider]    Allergies as of 03/21/2023 - Review Complete 03/21/2023  Allergen Reaction Noted   Codeine Itching 09/05/2011    Family History  Problem Relation Age of Onset   Heart attack Mother    Diabetes Father    Colon cancer Neg Hx    Gastric cancer Neg Hx    Esophageal cancer Neg Hx     Social History   Socioeconomic History   Marital status: Married    Spouse name: Not on file   Number of children: Not on file   Years of education: Not on file   Highest education level: Not on file  Occupational History   Not on file  Tobacco Use   Smoking status: Former  Current packs/day: 0.00    Average packs/day: 0.3 packs/day for 49.1 years (12.3 ttl pk-yrs)    Types: Cigarettes    Start date: 03/07/1969    Quit date: 04/03/2018    Years since quitting: 4.9   Smokeless tobacco: Never  Vaping Use   Vaping status: Never Used  Substance and Sexual Activity   Alcohol use: No   Drug use: No   Sexual activity: Yes    Birth control/protection: None  Other Topics Concern   Not on file  Social History Narrative   Not on file   Social Drivers of Health   Financial Resource Strain: Patient Declined (10/26/2022)   Received from [facility]   Overall Financial Resource Strain (CARDIA)    Difficulty of Paying Living Expenses: Patient declined  Food Insecurity: Patient Declined (10/26/2022)   Received from [facility]   Hunger Vital Sign    Worried About Running Out of Food in the Last Year: Patient declined    Ran Out of Food in the Last Year: Patient declined  Transportation Needs: No Transportation Needs (10/26/2022)   Received from [facility]   PRAPARE - Transportation    In the past 12 months, has lack of transportation kept you from medical appointments or from getting medications?: No    Lack of Transportation (Non-Medical): No   Physical Activity: Insufficiently Active (10/27/2021)   Received from [facility]   Exercise Vital Sign    Days of Exercise per Week: 2 days    Minutes of Exercise per Session: 30 min  Stress: No Stress Concern Present (10/27/2021)   Received from [facility] - Occupational Stress Questionnaire    Feeling of Stress : Only a little  Social Connections: Socially Integrated (10/27/2021)   Received from [facility]   Social Connection and Isolation Panel [NHANES]    Frequency of Communication with Friends and Family: More than three times a week    Frequency of Social Gatherings with Friends and Family: More than three times a week    Attends Religious Services: More than 4 times per year    Active Member of [employer] or Organizations: Yes    Attends Engineer, Structural: More than 4 times per year    Marital Status: Married  Catering Manager Violence: Not At Risk (09/01/2022)   Humiliation, Afraid, Rape, and Kick questionnaire    Fear of Current or Ex-Partner: No    Emotionally Abused: No    Physically Abused: No    Sexually Abused: No    Review of Systems: See HPI, otherwise negative ROS  Physical Exam: BP 118/63 (BP Location: Left Arm, Patient Position: Sitting, Cuff Size: Large)   Pulse 74   Temp 98.4 F (36.9 C) (Oral)   Ht 5' 10 (1.778 m)   Wt 195 lb (88.5 kg)   SpO2 96%   BMI 27.98 kg/m  General:   Alert, pleasant no acute distress.  Flat affect.  Accompanied by his wife. crackles, or rhonchi. No acute distress. Heart:  Regular rate and rhythm; no murmurs, clicks, rubs,  or gallops. Abdomen: Non-distended, normal bowel sounds.  Soft and nontender without appreciable mass or hepatosplenomegaly.  No succussion splash.  Impression/Plan: 73 year old gentleman with advanced Parkinson's disease GERD, gastroparesis and constipation likely multifactorial in etiology.  Reflux  symptoms well-controlled on Protonix .  Some anorexia from time to time previously counseled on a gastroparesis  diet and optimal control of blood sugars  Prokinetic agents available are not a good idea (i.e. Reglan ).  Constipation multifactorial in etiology now seemingly well-managed with the senna 2 tablets daily.  Would be still worthwhile for patient to see the dietitian to be versed  in a gastroparesis diet.  Pill /esophageal dysphagia of uncertain etiology may be Parkinson's,  could be an occult narrowing in the esophagus (patent normal-appearing tubular esophagus June 2024).  Colon adenomas-a future colonoscopy will be deferred given his other comorbidities.  Recommendations:    Continue 2 senna tablets daily.  Continue taking pantoprazole  or Protonix  40 mg twice daily-best taken 30 minutes before breakfast and supper  Will proceed with a barium pill esophagram to further evaluate  dysphagia.  Follow through on seeing the dietitian regarding a gastroparesis diet.  Further recommendations to follow.    Notice: This dictation was prepared with Dragon dictation along with smaller phrase technology. Any transcriptional errors that result from this process are unintentional and may not be corrected upon review.

## 2023-03-29 ENCOUNTER — Ambulatory Visit (HOSPITAL_COMMUNITY)
Admission: RE | Admit: 2023-03-29 | Discharge: 2023-03-29 | Disposition: A | Discharge status: Home / Self Care | Payer: Medicare Other | Source: Ambulatory Visit | Attending: Internal Medicine | Admitting: Internal Medicine

## 2023-03-29 DIAGNOSIS — R1319 Other dysphagia: Secondary | ICD-10-CM | POA: Diagnosis present

## 2023-03-30 ENCOUNTER — Telehealth: Payer: Self-pay | Admitting: *Deleted

## 2023-03-30 NOTE — Telephone Encounter (Signed)
Patient spouse called in requesting referral to dietician in [place] [facility] and left fax# of [phone]. Please advise Dr. Jena Gauss thanks!

## 2023-03-31 NOTE — Telephone Encounter (Signed)
Referral faxed

## 2023-08-10 ENCOUNTER — Encounter (INDEPENDENT_AMBULATORY_CARE_PROVIDER_SITE_OTHER): Payer: Self-pay | Admitting: *Deleted

## 2023-10-02 ENCOUNTER — Telehealth: Payer: Self-pay | Admitting: *Deleted

## 2023-10-02 NOTE — Telephone Encounter (Signed)
 Pt's wife Comer left vm stating that pt received a letter it was time for OV and to schedule colonoscopy.  She states pt is not able to leave the house at this time due to diabetic neuropathy in his legs, she says he is unable to walk. She says if there is any changes and he is able to come to office, she will call.

## 2024-03-07 DEATH — deceased
